# Patient Record
Sex: Male | Born: 1990 | Race: Black or African American | Hispanic: No | Marital: Single | Smoking: Former smoker
Health system: Southern US, Community
[De-identification: ages and names within clinical notes are randomized; demographics above are authoritative.]

## PROBLEM LIST (undated history)

## (undated) DIAGNOSIS — F329 Major depressive disorder, single episode, unspecified: Secondary | ICD-10-CM

## (undated) DIAGNOSIS — F32A Depression, unspecified: Secondary | ICD-10-CM

---

## 2013-02-23 ENCOUNTER — Emergency Department: Payer: Self-pay | Admitting: Emergency Medicine

## 2013-02-26 LAB — BETA STREP CULTURE(ARMC)

## 2013-10-02 ENCOUNTER — Emergency Department: Payer: Self-pay | Admitting: Internal Medicine

## 2013-10-02 LAB — BASIC METABOLIC PANEL
Anion Gap: 6 — ABNORMAL LOW (ref 7–16)
BUN: 9 mg/dL (ref 7–18)
CO2: 30 mmol/L (ref 21–32)
CREATININE: 1.01 mg/dL (ref 0.60–1.30)
Calcium, Total: 8.9 mg/dL (ref 8.5–10.1)
Chloride: 104 mmol/L (ref 98–107)
EGFR (African American): >60
GLUCOSE: 89 mg/dL (ref 65–99)
Osmolality: 278 (ref 275–301)
Potassium: 3.8 mmol/L (ref 3.5–5.1)
Sodium: 140 mmol/L (ref 136–145)

## 2013-10-02 LAB — CBC
HCT: 47.2 % (ref 40.0–52.0)
HGB: 15.3 g/dL (ref 13.0–18.0)
MCH: 29.3 pg (ref 26.0–34.0)
MCHC: 32.5 g/dL (ref 32.0–36.0)
MCV: 90 fL (ref 80–100)
Platelet: 204 10*3/uL (ref 150–440)
RBC: 5.22 10*6/uL (ref 4.40–5.90)
RDW: 14.1 % (ref 11.5–14.5)
WBC: 8.4 10*3/uL (ref 3.8–10.6)

## 2013-10-02 LAB — LIPASE, BLOOD: Lipase: 214 U/L (ref 73–393)

## 2013-10-02 LAB — MONONUCLEOSIS SCREEN: Mono Test: NEGATIVE

## 2013-10-05 LAB — BETA STREP CULTURE(ARMC)

## 2013-10-24 ENCOUNTER — Emergency Department: Payer: Self-pay | Admitting: Internal Medicine

## 2013-10-24 LAB — CBC WITH DIFFERENTIAL/PLATELET
Basophil #: 0.1 10*3/uL (ref 0.0–0.1)
Basophil %: 0.8 %
Eosinophil #: 0.2 10*3/uL (ref 0.0–0.7)
Eosinophil %: 3.1 %
HCT: 47.9 % (ref 40.0–52.0)
HGB: 15.2 g/dL (ref 13.0–18.0)
LYMPHS ABS: 2.5 10*3/uL (ref 1.0–3.6)
LYMPHS PCT: 32.9 %
MCH: 28.6 pg (ref 26.0–34.0)
MCHC: 31.8 g/dL — ABNORMAL LOW (ref 32.0–36.0)
MCV: 90 fL (ref 80–100)
Monocyte #: 0.6 x10 3/mm (ref 0.2–1.0)
Monocyte %: 8.1 %
NEUTROS PCT: 55.1 %
Neutrophil #: 4.1 10*3/uL (ref 1.4–6.5)
PLATELETS: 205 10*3/uL (ref 150–440)
RBC: 5.32 10*6/uL (ref 4.40–5.90)
RDW: 14.1 % (ref 11.5–14.5)
WBC: 7.5 10*3/uL (ref 3.8–10.6)

## 2013-10-24 LAB — COMPREHENSIVE METABOLIC PANEL
ALBUMIN: 3.8 g/dL (ref 3.4–5.0)
AST: 40 U/L — AB (ref 15–37)
Alkaline Phosphatase: 69 U/L
Anion Gap: 3 — ABNORMAL LOW (ref 7–16)
BUN: 11 mg/dL (ref 7–18)
Bilirubin,Total: 0.6 mg/dL (ref 0.2–1.0)
CHLORIDE: 106 mmol/L (ref 98–107)
CREATININE: 0.9 mg/dL (ref 0.60–1.30)
Calcium, Total: 8.7 mg/dL (ref 8.5–10.1)
Co2: 29 mmol/L (ref 21–32)
Glucose: 86 mg/dL (ref 65–99)
OSMOLALITY: 274 (ref 275–301)
Potassium: 4 mmol/L (ref 3.5–5.1)
SGPT (ALT): 85 U/L — ABNORMAL HIGH
SODIUM: 138 mmol/L (ref 136–145)
TOTAL PROTEIN: 7.8 g/dL (ref 6.4–8.2)

## 2013-10-24 LAB — URINALYSIS, COMPLETE
BILIRUBIN, UR: NEGATIVE
BLOOD: NEGATIVE
Bacteria: NONE SEEN
GLUCOSE, UR: NEGATIVE mg/dL (ref 0–75)
Ketone: NEGATIVE
Leukocyte Esterase: NEGATIVE
Nitrite: NEGATIVE
PROTEIN: NEGATIVE
Ph: 6 (ref 4.5–8.0)
RBC,UR: <1 /HPF (ref 0–5)
SPECIFIC GRAVITY: 1.015 (ref 1.003–1.030)
Squamous Epithelial: NONE SEEN

## 2013-10-24 LAB — LIPASE, BLOOD: Lipase: 242 U/L (ref 73–393)

## 2015-01-20 ENCOUNTER — Emergency Department
Admission: EM | Admit: 2015-01-20 | Discharge: 2015-01-20 | Disposition: A | Discharge status: Home / Self Care | Payer: Self-pay | Attending: Emergency Medicine | Admitting: Emergency Medicine

## 2015-01-20 DIAGNOSIS — Y9289 Other specified places as the place of occurrence of the external cause: Secondary | ICD-10-CM | POA: Insufficient documentation

## 2015-01-20 DIAGNOSIS — F17200 Nicotine dependence, unspecified, uncomplicated: Secondary | ICD-10-CM | POA: Insufficient documentation

## 2015-01-20 DIAGNOSIS — Y9389 Activity, other specified: Secondary | ICD-10-CM | POA: Insufficient documentation

## 2015-01-20 DIAGNOSIS — Y990 Civilian activity done for income or pay: Secondary | ICD-10-CM | POA: Insufficient documentation

## 2015-01-20 DIAGNOSIS — W268XXA Contact with other sharp object(s), not elsewhere classified, initial encounter: Secondary | ICD-10-CM | POA: Insufficient documentation

## 2015-01-20 DIAGNOSIS — S61012A Laceration without foreign body of left thumb without damage to nail, initial encounter: Secondary | ICD-10-CM | POA: Insufficient documentation

## 2015-01-20 NOTE — ED Notes (Signed)
States he was cutting a wire with a sharp blade yesterday, laceration noted to tip of left thumb   Bleeding controlled at present but states he was unable to control bleeding yesterday

## 2015-01-20 NOTE — ED Notes (Signed)
Pt states he cut his left thumb on wire yesterday

## 2015-01-20 NOTE — ED Notes (Signed)
Pt asked by this tech if this would be filed under [employer] and he states "no I'll handle it" First nurse notified

## 2015-01-20 NOTE — ED Provider Notes (Signed)
[facility] Emergency Department Provider Note  ____________________________________________  Time seen: Approximately 10:13AM  I have reviewed the triage vital signs and the nursing notes.   HISTORY  Chief Complaint Laceration   HPI Charles Harper is a 24 y.o. male who presents to the emergency department for evaluation of left thumb injury. He cut it at work yesterday. He states that it bled a lot initially, but no bleeding since last night. His tetanus shot is not up to date.   History reviewed. No pertinent past medical history.  There are no active problems to display for this patient.   History reviewed. No pertinent past surgical history.  No current outpatient prescriptions on file.  Allergies Review of patient's allergies indicates no known allergies.  No family history on file.  Social History Social History  Substance Use Topics  . Smoking status: Current Every Day Smoker  . Smokeless tobacco: None  . Alcohol Use: No    Review of Systems   Constitutional: No fever/chills Eyes: No visual changes. ENT: No congestion or rhinorrhea Cardiovascular: Denies chest pain. Respiratory: Denies shortness of breath. Gastrointestinal: No abdominal pain.  No nausea, no vomiting.  No diarrhea.  No constipation. Genitourinary: Negative for dysuria. Musculoskeletal: Negative for back pain. Skin: laceration to the left thumb Neurological: Negative for headaches, focal weakness or numbness.  10-point ROS otherwise negative.  ____________________________________________   PHYSICAL EXAM:  VITAL SIGNS: ED Triage Vitals  Enc Vitals Group     BP 01/20/15 1005 129/75 mmHg     Pulse Rate 01/20/15 1005 103     Resp 01/20/15 1005 16     Temp 01/20/15 1005 98.2 F (36.8 C)     Temp Source 01/20/15 1005 Oral     SpO2 01/20/15 1005 99 %     Weight 01/20/15 1005 205 lb (92.987 kg)     Height 01/20/15 1005 5\' 10"  (1.778 m)     Head Cir --       Peak Flow --      Pain Score 01/20/15 1005 4     Pain Loc --      Pain Edu? --      Excl. in GC? --     Constitutional: Alert and oriented. Well appearing and in no acute distress. Eyes: Conjunctivae are normal. PERRL. EOMI. Head: Atraumatic. Nose: No congestion/rhinnorhea. Mouth/Throat: Mucous membranes are moist.  Oropharynx non-erythematous. No oral lesions. Neck: No stridor. Cardiovascular: Normal rate, regular rhythm.  Good peripheral circulation. Respiratory: Normal respiratory effort.  No retractions. Lungs CTAB. Gastrointestinal: Soft and nontender. No distention. No abdominal bruits.  Musculoskeletal: No lower extremity tenderness nor edema.  No joint effusions. Neurologic:  Normal speech and language. No gross focal neurologic deficits are appreciated. Speech is normal. No gait instability. Skin: 1.5cm laceration to the medial aspect of the left thumb; Negative for petechiae.  Psychiatric: Mood and affect are normal. Speech and behavior are normal.  ____________________________________________   LABS (all labs ordered are listed, but only abnormal results are displayed)  Labs Reviewed - No data to display ____________________________________________  EKG   ____________________________________________  RADIOLOGY  Not indicated. ____________________________________________   PROCEDURES  Procedure(s) performed: None ____________________________________________   INITIAL IMPRESSION / ASSESSMENT AND PLAN / ED COURSE  Pertinent labs & imaging results that were available during my care of the patient were reviewed by me and considered in my medical decision making (see chart for details).  Laceration not sutured due to time since injury. Thumb soaked in betadine  and normal saline. Tetanus shot updated. Patient was given verbal wound care instructions and advised to follow up with his PCP or return to the ER for symptoms of concern.   ____________________________________________   FINAL CLINICAL IMPRESSION(S) / ED DIAGNOSES  Final diagnoses:  Laceration of left thumb, initial encounter       Chinita Pester, FNP 01/20/15 1806  Arnaldo Natal, MD 01/22/15 [phone]

## 2015-06-01 ENCOUNTER — Emergency Department
Admission: EM | Admit: 2015-06-01 | Discharge: 2015-06-01 | Disposition: A | Discharge status: Home / Self Care | Payer: Self-pay | Attending: Student | Admitting: Student

## 2015-06-01 DIAGNOSIS — J029 Acute pharyngitis, unspecified: Secondary | ICD-10-CM | POA: Insufficient documentation

## 2015-06-01 DIAGNOSIS — F17200 Nicotine dependence, unspecified, uncomplicated: Secondary | ICD-10-CM | POA: Insufficient documentation

## 2015-06-01 DIAGNOSIS — R112 Nausea with vomiting, unspecified: Secondary | ICD-10-CM | POA: Insufficient documentation

## 2015-06-01 LAB — CBC WITH DIFFERENTIAL/PLATELET
BASOS ABS: 0 10*3/uL (ref 0–0.1)
Basophils Relative: 0 %
EOS ABS: 0.2 10*3/uL (ref 0–0.7)
EOS PCT: 3 %
HCT: 46 % (ref 40.0–52.0)
Hemoglobin: 15.3 g/dL (ref 13.0–18.0)
LYMPHS ABS: 2.2 10*3/uL (ref 1.0–3.6)
LYMPHS PCT: 38 %
MCH: 28.6 pg (ref 26.0–34.0)
MCHC: 33.2 g/dL (ref 32.0–36.0)
MCV: 86.1 fL (ref 80.0–100.0)
MONO ABS: 0.5 10*3/uL (ref 0.2–1.0)
Monocytes Relative: 8 %
Neutro Abs: 2.8 10*3/uL (ref 1.4–6.5)
Neutrophils Relative %: 51 %
PLATELETS: 210 10*3/uL (ref 150–440)
RBC: 5.34 MIL/uL (ref 4.40–5.90)
RDW: 14.6 % — AB (ref 11.5–14.5)
WBC: 5.6 10*3/uL (ref 3.8–10.6)

## 2015-06-01 LAB — COMPREHENSIVE METABOLIC PANEL
ALT: 22 U/L (ref 17–63)
AST: 23 U/L (ref 15–41)
Albumin: 4.3 g/dL (ref 3.5–5.0)
Alkaline Phosphatase: 50 U/L (ref 38–126)
Anion gap: 9 (ref 5–15)
BUN: 7 mg/dL (ref 6–20)
CHLORIDE: 105 mmol/L (ref 101–111)
CO2: 25 mmol/L (ref 22–32)
Calcium: 9.2 mg/dL (ref 8.9–10.3)
Creatinine, Ser: 0.82 mg/dL (ref 0.61–1.24)
Glucose, Bld: 85 mg/dL (ref 65–99)
POTASSIUM: 3.5 mmol/L (ref 3.5–5.1)
SODIUM: 139 mmol/L (ref 135–145)
Total Bilirubin: 1.2 mg/dL (ref 0.3–1.2)
Total Protein: 7.9 g/dL (ref 6.5–8.1)

## 2015-06-01 LAB — POCT RAPID STREP A: Streptococcus, Group A Screen (Direct): NEGATIVE

## 2015-06-01 LAB — LIPASE, BLOOD: LIPASE: 47 U/L (ref 11–51)

## 2015-06-01 MED ORDER — MORPHINE SULFATE (PF) 4 MG/ML IV SOLN
INTRAVENOUS | Status: AC
  Filled 2015-06-01: qty 1

## 2015-06-01 MED ORDER — SODIUM CHLORIDE 0.9 % IV BOLUS (SEPSIS)
1000.0000 mL | Freq: Once | INTRAVENOUS | Status: AC
  Administered 2015-06-01: 1000 mL via INTRAVENOUS

## 2015-06-01 MED ORDER — ONDANSETRON HCL 4 MG/2ML IJ SOLN
4.0000 mg | Freq: Once | INTRAMUSCULAR | Status: AC
  Administered 2015-06-01: 4 mg via INTRAVENOUS
  Filled 2015-06-01: qty 2

## 2015-06-01 MED ORDER — ONDANSETRON 4 MG PO TBDP: 4.0000 mg | ORAL_TABLET | Freq: Three times a day (TID) | ORAL | Status: DC | PRN

## 2015-06-01 NOTE — ED Provider Notes (Signed)
[facility] Emergency Department Provider Note   ____________________________________________  Time seen: Approximately 7:12 AM  I have reviewed the triage vital signs and the nursing notes.   HISTORY  Chief Complaint Emesis    HPI Tia AlertJaquan All is a 25 y.o. male with no chronic medical problems who presents for evaluation of multiple episodes of nonbloody nonbilious emesis over the past 3 days, gradual onset, constant since onset, moderate, no modifying factors. He also has mild sore throat and cough. No fevers, no diarrhea, continues to pass flatus. No abdominal pain. No known sick contacts. No chest pain or difficulty breathing. No dysuria or hematuria.   No past medical history on file.  There are no active problems to display for this patient.   No past surgical history on file.  Current Outpatient Rx  Name  Route  Sig  Dispense  Refill  . ondansetron (ZOFRAN ODT) 4 MG disintegrating tablet   Oral   Take 1 tablet (4 mg total) by mouth every 8 (eight) hours as needed for nausea or vomiting.   12 tablet   0     Allergies Review of patient's allergies indicates no known allergies.  No family history on file.  Social History Social History  Substance Use Topics  . Smoking status: Current Every Day Smoker  . Smokeless tobacco: Not on file  . Alcohol Use: No    Review of Systems Constitutional: No fever/chills Eyes: No visual changes. ENT: + sore throat. Cardiovascular: Denies chest pain. Respiratory: Denies shortness of breath. Gastrointestinal: No abdominal pain.  + nausea, + vomiting.  No diarrhea.  No constipation. Genitourinary: Negative for dysuria. Musculoskeletal: Negative for back pain. Skin: Negative for rash. Neurological: Negative for headaches, focal weakness or numbness.  10-point ROS otherwise negative.  ____________________________________________   PHYSICAL EXAM:  VITAL SIGNS: ED Triage Vitals  Enc  Vitals Group     BP 06/01/15 0632 139/88 mmHg     Pulse Rate 06/01/15 0632 94     Resp 06/01/15 0632 18     Temp 06/01/15 0632 98.3 F (36.8 C)     Temp Source 06/01/15 0632 Oral     SpO2 06/01/15 0632 100 %     Weight 06/01/15 0632 180 lb (81.647 kg)     Height 06/01/15 0632 5\' 10"  (1.778 m)     Head Cir --      Peak Flow --      Pain Score 06/01/15 0633 4     Pain Loc --      Pain Edu? --      Excl. in GC? --     Constitutional: Alert and oriented. Well appearing and in no acute distress. Eyes: Conjunctivae are normal. PERRL. EOMI. Head: Atraumatic. Nose: No congestion/rhinnorhea. Mouth/Throat: Mucous membranes are moist.  Oropharynx mildly erythematous without exudate. Neck: No stridor. Supple without meningismus. Cardiovascular: Normal rate, regular rhythm. Grossly normal heart sounds.  Good peripheral circulation. Respiratory: Normal respiratory effort.  No retractions. Lungs CTAB. Gastrointestinal: Soft and nontender. No distention.  No CVA tenderness. Genitourinary: deferred Musculoskeletal: No lower extremity tenderness nor edema.  No joint effusions. Neurologic:  Normal speech and language. No gross focal neurologic deficits are appreciated. No gait instability. Skin:  Skin is warm, dry and intact. No rash noted. Psychiatric: Mood and affect are normal. Speech and behavior are normal.  ____________________________________________   LABS (all labs ordered are listed, but only abnormal results are displayed)  Labs Reviewed  CBC WITH DIFFERENTIAL/PLATELET - Abnormal;  Notable for the following:    RDW 14.6 (*)    All other components within normal limits  COMPREHENSIVE METABOLIC PANEL  LIPASE, BLOOD  POCT RAPID STREP A   ____________________________________________  EKG  none ____________________________________________  RADIOLOGY  none ____________________________________________   PROCEDURES  Procedure(s) performed: None  Critical Care performed:  No  ____________________________________________   INITIAL IMPRESSION / ASSESSMENT AND PLAN / ED COURSE  Pertinent labs & imaging results that were available during my care of the patient were reviewed by me and considered in my medical decision making (see chart for details).  Jaquille Kau is a 25 y.o. male with no chronic medical problems who presents for evaluation of multiple episodes of nonbloody nonbilious emesis over the past 3 days. On exam, he is generally well-appearing and in no acute distress. Vital signs stable, he is afebrile. He does have mild erythema in the oropharynx without swelling or exudate. His abdominal exam is benign. Suspect viral syndrome as the most likely cause of his constellation of symptoms. CBC CMP, lipase unremarkable. We'll treat symptomatically with IV fluids and antiemetics and anticipate discharge with PCP follow-up and return precautions after he successfully by mouth challenges.   ----------------------------------------- 9:05 AM on 06/01/2015 ----------------------------------------- Strep negative. Patient tolerating by mouth in the emergency department without vomiting. Discussed return precautions, need for close follow-up and he is comfortable with the discharge plan. DC home.  ____________________________________________   FINAL CLINICAL IMPRESSION(S) / ED DIAGNOSES  Final diagnoses:  Non-intractable vomiting with nausea, vomiting of unspecified type  Sore throat      NEW MEDICATIONS STARTED DURING THIS VISIT:  New Prescriptions   ONDANSETRON (ZOFRAN ODT) 4 MG DISINTEGRATING TABLET    Take 1 tablet (4 mg total) by mouth every 8 (eight) hours as needed for nausea or vomiting.     Note:  This document was prepared using Dragon voice recognition software and may include unintentional dictation errors.    Gayla Doss, MD 06/01/15 [phone]

## 2015-06-01 NOTE — ED Notes (Signed)
Pt reports that he started vomiting 3 days ago (has vomited 8 times in the last 24 hours) - pt c/o sore throat in the last 24 hours - pt denies diarrhea - last normal BM was Monday - pt denies any other symptoms

## 2015-06-01 NOTE — Discharge Instructions (Signed)
Nausea and Vomiting  Nausea means you feel sick to your stomach. Throwing up (vomiting) is a reflex where stomach contents come out of your mouth.  HOME CARE   · Take medicine as told by your doctor.  · Do not force yourself to eat. However, you do need to drink fluids.  · If you feel like eating, eat a normal diet as told by your doctor.    Eat rice, wheat, potatoes, bread, lean meats, yogurt, fruits, and vegetables.    Avoid high-fat foods.  · Drink enough fluids to keep your pee (urine) clear or pale yellow.  · Ask your doctor how to replace body fluid losses (rehydrate). Signs of body fluid loss (dehydration) include:    Feeling very thirsty.    Dry lips and mouth.    Feeling dizzy.    Dark pee.    Peeing less than normal.    Feeling confused.    Fast breathing or heart rate.  GET HELP RIGHT AWAY IF:   · You have blood in your throw up.  · You have black or bloody poop (stool).  · You have a bad headache or stiff neck.  · You feel confused.  · You have bad belly (abdominal) pain.  · You have chest pain or trouble breathing.  · You do not pee at least once every 8 hours.  · You have cold, clammy skin.  · You keep throwing up after 24 to 48 hours.  · You have a fever.  MAKE SURE YOU:   · Understand these instructions.  · Will watch your condition.  · Will get help right away if you are not doing well or get worse.     This information is not intended to replace advice given to you by your health care provider. Make sure you discuss any questions you have with your health care provider.     Document Released: 06/27/2007 Document Revised: 04/02/2011 Document Reviewed: 06/09/2010  Elsevier Interactive Patient Education ©2016 Else[employer] Inc.

## 2015-06-01 NOTE — ED Notes (Signed)
Pt in with co vomiting x 3 days co abd pain and states throat is irritated.

## 2015-06-06 ENCOUNTER — Encounter: Payer: Self-pay | Admitting: *Deleted

## 2015-06-06 ENCOUNTER — Emergency Department
Admission: EM | Admit: 2015-06-06 | Discharge: 2015-06-06 | Disposition: A | Discharge status: Home / Self Care | Payer: Self-pay | Attending: Emergency Medicine | Admitting: Emergency Medicine

## 2015-06-06 DIAGNOSIS — R1013 Epigastric pain: Secondary | ICD-10-CM

## 2015-06-06 DIAGNOSIS — F17210 Nicotine dependence, cigarettes, uncomplicated: Secondary | ICD-10-CM | POA: Insufficient documentation

## 2015-06-06 DIAGNOSIS — K859 Acute pancreatitis without necrosis or infection, unspecified: Secondary | ICD-10-CM | POA: Insufficient documentation

## 2015-06-06 DIAGNOSIS — K2970 Gastritis, unspecified, without bleeding: Secondary | ICD-10-CM | POA: Insufficient documentation

## 2015-06-06 LAB — URINALYSIS COMPLETE WITH MICROSCOPIC (ARMC ONLY)
Bacteria, UA: NONE SEEN
Bilirubin Urine: NEGATIVE
Glucose, UA: NEGATIVE mg/dL
HGB URINE DIPSTICK: NEGATIVE
Ketones, ur: NEGATIVE mg/dL
LEUKOCYTES UA: NEGATIVE
NITRITE: NEGATIVE
PH: 5 (ref 5.0–8.0)
PROTEIN: NEGATIVE mg/dL
Specific Gravity, Urine: 1.021 (ref 1.005–1.030)

## 2015-06-06 LAB — COMPREHENSIVE METABOLIC PANEL
ALK PHOS: 50 U/L (ref 38–126)
ALT: 30 U/L (ref 17–63)
AST: 31 U/L (ref 15–41)
Albumin: 4.2 g/dL (ref 3.5–5.0)
Anion gap: 7 (ref 5–15)
BILIRUBIN TOTAL: 0.5 mg/dL (ref 0.3–1.2)
BUN: 12 mg/dL (ref 6–20)
CALCIUM: 8.9 mg/dL (ref 8.9–10.3)
CO2: 26 mmol/L (ref 22–32)
CREATININE: 0.85 mg/dL (ref 0.61–1.24)
Chloride: 106 mmol/L (ref 101–111)
GFR calc Af Amer: >60 mL/min (ref >60)
GFR calc non Af Amer: >60 mL/min (ref >60)
Glucose, Bld: 91 mg/dL (ref 65–99)
POTASSIUM: 4.3 mmol/L (ref 3.5–5.1)
Sodium: 139 mmol/L (ref 135–145)
TOTAL PROTEIN: 7.7 g/dL (ref 6.5–8.1)

## 2015-06-06 LAB — LIPASE, BLOOD: Lipase: 102 U/L — ABNORMAL HIGH (ref 11–51)

## 2015-06-06 LAB — CBC
HEMATOCRIT: 48.2 % (ref 40.0–52.0)
Hemoglobin: 15.9 g/dL (ref 13.0–18.0)
MCH: 28.4 pg (ref 26.0–34.0)
MCHC: 33.1 g/dL (ref 32.0–36.0)
MCV: 85.9 fL (ref 80.0–100.0)
PLATELETS: 208 10*3/uL (ref 150–440)
RBC: 5.61 MIL/uL (ref 4.40–5.90)
RDW: 14.8 % — AB (ref 11.5–14.5)
WBC: 6.8 10*3/uL (ref 3.8–10.6)

## 2015-06-06 LAB — TROPONIN I: Troponin I: <0.03 ng/mL (ref <0.031)

## 2015-06-06 MED ORDER — ONDANSETRON HCL 4 MG/2ML IJ SOLN
4.0000 mg | Freq: Once | INTRAMUSCULAR | Status: AC
  Administered 2015-06-06: 4 mg via INTRAVENOUS
  Filled 2015-06-06: qty 2

## 2015-06-06 MED ORDER — ONDANSETRON HCL 4 MG/2ML IJ SOLN
INTRAMUSCULAR | Status: AC
  Administered 2015-06-06: 4 mg via INTRAVENOUS
  Filled 2015-06-06: qty 2

## 2015-06-06 MED ORDER — ONDANSETRON 4 MG PO TBDP: 4.0000 mg | ORAL_TABLET | Freq: Three times a day (TID) | ORAL | Status: DC | PRN

## 2015-06-06 MED ORDER — SODIUM CHLORIDE 0.9 % IV BOLUS (SEPSIS)
1000.0000 mL | Freq: Once | INTRAVENOUS | Status: AC
  Administered 2015-06-06: 1000 mL via INTRAVENOUS

## 2015-06-06 MED ORDER — ONDANSETRON HCL 4 MG/2ML IJ SOLN
4.0000 mg | Freq: Once | INTRAMUSCULAR | Status: AC | PRN
  Administered 2015-06-06: 4 mg via INTRAVENOUS

## 2015-06-06 MED ORDER — GI COCKTAIL ~~LOC~~
30.0000 mL | Freq: Once | ORAL | Status: AC
  Administered 2015-06-06: 30 mL via ORAL
  Filled 2015-06-06: qty 30

## 2015-06-06 MED ORDER — PANTOPRAZOLE SODIUM 40 MG PO TBEC: 40.0000 mg | DELAYED_RELEASE_TABLET | Freq: Every day | ORAL | Status: DC

## 2015-06-06 MED ORDER — HYDROCODONE-ACETAMINOPHEN 5-325 MG PO TABS: 1.0000 | ORAL_TABLET | ORAL | Status: DC | PRN

## 2015-06-06 NOTE — ED Provider Notes (Addendum)
Patient feeling much better, I had initially ordered a dose of morphine, but the patient did not want to take this as he wanted to drive home. The pain did ease off on its own and we discussed return precautions.  Patient was discharged with Dr. Awanda MinkPaduchowski's prepared discharge follow-up instructions.  Governor Rooksebecca Del Overfelt, MD 06/06/15 [phone]  Governor Rooksebecca Nelissa Bolduc, MD 06/06/15 [phone]

## 2015-06-06 NOTE — Discharge Instructions (Signed)
Please take your medications as prescribed. Return to the emergency department for any acute worsening of pain, vomiting unable to keep down fluids, or any other symptom personally concerning to yourself. Otherwise please follow-up with GI medicine by calling the number provided. As we discussed avoid alcohol, as well as NSAID use such as ibuprofen/Motrin/aspirin. Please use over-the-counter Maalox as needed for abdominal discomfort.   Abdominal Pain, Adult Many things can cause abdominal pain. Usually, abdominal pain is not caused by a disease and will improve without treatment. It can often be observed and treated at home. Your health care provider will do a physical exam and possibly order blood tests and X-rays to help determine the seriousness of your pain. However, in many cases, more time must pass before a clear cause of the pain can be found. Before that point, your health care provider may not know if you need more testing or further treatment. HOME CARE INSTRUCTIONS Monitor your abdominal pain for any changes. The following actions may help to alleviate any discomfort you are experiencing:  Only take over-the-counter or prescription medicines as directed by your health care provider.  Do not take laxatives unless directed to do so by your health care provider.  Try a clear liquid diet (broth, tea, or water) as directed by your health care provider. Slowly move to a bland diet as tolerated. SEEK MEDICAL CARE IF:  You have unexplained abdominal pain.  You have abdominal pain associated with nausea or diarrhea.  You have pain when you urinate or have a bowel movement.  You experience abdominal pain that wakes you in the night.  You have abdominal pain that is worsened or improved by eating food.  You have abdominal pain that is worsened with eating fatty foods.  You have a fever. SEEK IMMEDIATE MEDICAL CARE IF:  Your pain does not go away within 2 hours.  You keep throwing up  (vomiting).  Your pain is felt only in portions of the abdomen, such as the right side or the left lower portion of the abdomen.  You pass bloody or black tarry stools. MAKE SURE YOU:  Understand these instructions.  Will watch your condition.  Will get help right away if you are not doing well or get worse.   This information is not intended to replace advice given to you by your health care provider. Make sure you discuss any questions you have with your health care provider.   Document Released: 10/18/2004 Document Revised: 09/29/2014 Document Reviewed: 09/17/2012 Elsevier Interactive Patient Education 2016 Elsevier Inc.  Gastritis, Adult Gastritis is soreness and puffiness (inflammation) of the lining of the stomach. If you do not get help, gastritis can cause bleeding and sores (ulcers) in the stomach. HOME CARE   Only take medicine as told by your doctor.  If you were given antibiotic medicines, take them as told. Finish the medicines even if you start to feel better.  Drink enough fluids to keep your pee (urine) clear or pale yellow.  Avoid foods and drinks that make your problems worse. Foods you may want to avoid include:  Caffeine or alcohol.  Chocolate.  Mint.  Garlic and onions.  Spicy foods.  Citrus fruits, including oranges, lemons, or limes.  Food containing tomatoes, including sauce, chili, salsa, and pizza.  Fried and fatty foods.  Eat small meals throughout the day instead of large meals. GET HELP RIGHT AWAY IF:   You have black or dark red poop (stools).  You throw up (vomit)  blood. It may look like coffee grounds.  You cannot keep fluids down.  Your belly (abdominal) pain gets worse.  You have a fever.  You do not feel better after 1 week.  You have any other questions or concerns. MAKE SURE YOU:   Understand these instructions.  Will watch your condition.  Will get help right away if you are not doing well or get worse.     This information is not intended to replace advice given to you by your health care provider. Make sure you discuss any questions you have with your health care provider.   Document Released: 06/27/2007 Document Revised: 04/02/2011 Document Reviewed: 02/21/2011 Elsevier Interactive Patient Education 2016 Elsevier Inc.  Acute Pancreatitis Acute pancreatitis is a disease in which the pancreas becomes suddenly inflamed. The pancreas is a large gland located behind your stomach. The pancreas produces enzymes that help digest food. The pancreas also releases the hormones glucagon and insulin that help regulate blood sugar. Damage to the pancreas occurs when the digestive enzymes from the pancreas are activated and begin attacking the pancreas before being released into the intestine. Most acute attacks last a couple of days and can cause serious complications. Some people become dehydrated and develop low blood pressure. In severe cases, bleeding into the pancreas can lead to shock and can be life-threatening. The lungs, heart, and kidneys may fail. CAUSES  Pancreatitis can happen to anyone. In some cases, the cause is unknown. Most cases are caused by:  Alcohol abuse.  Gallstones. Other less common causes are:  Certain medicines.  Exposure to certain chemicals.  Infection.  Damage caused by an accident (trauma).  Abdominal surgery. SYMPTOMS   Pain in the upper abdomen that may radiate to the back.  Tenderness and swelling of the abdomen.  Nausea and vomiting. DIAGNOSIS  Your caregiver will perform a physical exam. Blood and stool tests may be done to confirm the diagnosis. Imaging tests may also be done, such as X-rays, CT scans, or an ultrasound of the abdomen. TREATMENT  Treatment usually requires a stay in the hospital. Treatment may include:  Pain medicine.  Fluid replacement through an intravenous line (IV).  Placing a tube in the stomach to remove stomach contents and  control vomiting.  Not eating for 3 or 4 days. This gives your pancreas a rest, because enzymes are not being produced that can cause further damage.  Antibiotic medicines if your condition is caused by an infection.  Surgery of the pancreas or gallbladder. HOME CARE INSTRUCTIONS   Follow the diet advised by your caregiver. This may involve avoiding alcohol and decreasing the amount of fat in your diet.  Eat smaller, more frequent meals. This reduces the amount of digestive juices the pancreas produces.  Drink enough fluids to keep your urine clear or pale yellow.  Only take over-the-counter or prescription medicines as directed by your caregiver.  Avoid drinking alcohol if it caused your condition.  Do not smoke.  Get plenty of rest.  Check your blood sugar at home as directed by your caregiver.  Keep all follow-up appointments as directed by your caregiver. SEEK MEDICAL CARE IF:   You do not recover as quickly as expected.  You develop new or worsening symptoms.  You have persistent pain, weakness, or nausea.  You recover and then have another episode of pain. SEEK IMMEDIATE MEDICAL CARE IF:   You are unable to eat or keep fluids down.  Your pain becomes severe.  You have a  fever or persistent symptoms for more than 2 to 3 days.  You have a fever and your symptoms suddenly get worse.  Your skin or the white part of your eyes turn yellow (jaundice).  You develop vomiting.  You feel dizzy, or you faint.  Your blood sugar is high (over 300 mg/dL). MAKE SURE YOU:   Understand these instructions.  Will watch your condition.  Will get help right away if you are not doing well or get worse.   This information is not intended to replace advice given to you by your health care provider. Make sure you discuss any questions you have with your health care provider.   Document Released: 01/08/2005 Document Revised: 07/10/2011 Document Reviewed: 04/19/2011 Elsevier  Interactive Patient Education [employer]

## 2015-06-06 NOTE — ED Notes (Signed)
Pt c/o LUQ abdominal pain, n/v since Friday, worsening on Sunday. pT REENTLY SEEN FOR SAME LESS THAN A WEEK AGO. pT ACTIVELY VOMITING during triage.

## 2015-06-06 NOTE — ED Provider Notes (Signed)
[facility] Emergency Department Provider Note  Time seen: 6:34 AM  I have reviewed the triage vital signs and the nursing notes.   HISTORY  Chief Complaint Emesis    HPI Charles Harper is a 25 y.o. male with no past medical history who presents the emergency department with upper abdominal pain, nausea and vomiting. According to the patient for the past week he has been having upper abdominal pain, nausea and vomiting. He states he was seen in the emergency department 06/01/15 for the same. States his symptoms were improving on the medications he was prescribed however he drank 1 shot of whiskey Saturday, and beginning Saturday night he states all the pain, nausea and vomiting return, patient continued with pain, nausea and vomiting Sunday so he came to the emergency department this morning. Patient describes the pain as moderate to severe epigastric to left upper quadrant pain. Burning in quality. Denies diarrhea. States nausea and vomiting. Denies fever. Denies chest pain or shortness of breath.     History reviewed. No pertinent past medical history.  There are no active problems to display for this patient.   History reviewed. No pertinent past surgical history.  Current Outpatient Rx  Name  Route  Sig  Dispense  Refill  . ondansetron (ZOFRAN ODT) 4 MG disintegrating tablet   Oral   Take 1 tablet (4 mg total) by mouth every 8 (eight) hours as needed for nausea or vomiting.   12 tablet   0     Allergies Review of patient's allergies indicates no known allergies.  History reviewed. No pertinent family history.  Social History Social History  Substance Use Topics  . Smoking status: Current Every Day Smoker    Types: Cigarettes  . Smokeless tobacco: Never Used  . Alcohol Use: Yes     Comment: occasionally    Review of Systems Constitutional: Negative for fever. Cardiovascular: Negative for chest pain. Respiratory: Negative for shortness of  breath. Gastrointestinal: Positive for epigastric and left upper quadrant pain. Positive for nausea and vomiting. Negative for diarrhea. Genitourinary: Negative for dysuria. Musculoskeletal: Negative for back pain. Neurological: Negative for headache 10-point ROS otherwise negative.  ____________________________________________   PHYSICAL EXAM:  VITAL SIGNS: ED Triage Vitals  Enc Vitals Group     BP 06/06/15 0611 156/77 mmHg     Pulse Rate 06/06/15 0611 81     Resp 06/06/15 0611 20     Temp 06/06/15 0611 98 F (36.7 C)     Temp Source 06/06/15 0611 Oral     SpO2 06/06/15 0611 98 %     Weight 06/06/15 0611 190 lb (86.183 kg)     Height 06/06/15 0611  (1.727 m)     Head Cir --      Peak Flow --      Pain Score 06/06/15 0612 10     Pain Loc --      Pain Edu? --      Excl. in GC? --     Constitutional: Alert and oriented. Well appearing and in no distress. Eyes: Normal exam ENT   Head: Normocephalic and atraumatic.   Mouth/Throat: Mucous membranes are moist. Cardiovascular: Normal rate, regular rhythm. No murmur Respiratory: Normal respiratory effort without tachypnea nor retractions. Breath sounds are clear Gastrointestinal: Soft, moderate epigastric and left upper quadrant tenderness to palpation without rebound or guarding. No distention. Musculoskeletal: Nontender with normal range of motion in all extremities.  Neurologic:  Normal speech and language. No gross focal  neurologic deficits  Skin:  Skin is warm, dry and intact.  Psychiatric: Mood and affect are normal.   ____________________________________________    INITIAL IMPRESSION / ASSESSMENT AND PLAN / ED COURSE  Pertinent labs & imaging results that were available during my care of the patient were reviewed by me and considered in my medical decision making (see chart for details).  The patient presents the emergency department with continued epigastric and left upper quadrant pain along with  nausea and vomiting. She was seen for the same 06/01/15. His symptoms sound very suggestive of gastritis especially as it was provoked with alcohol over the weekend. We will check labs, dosage GI cocktail as well as Zofran for nausea. We will IV hydrate and closely monitor in the emergency department.   EKG reviewed and interpreted by myself shows normal sinus rhythm at 75 bpm, narrow QRS, normal axis, normal intervals, mild ST elevations in the lateral leads most consistent with early repolarization. No reciprocal depressions. Overall reassuring EKG.  Labs most consistent with pancreatitis, lipase of 100. I discussed with the patient the results, the need to stop drinking alcohol. As long as pain is controlled we plan to discharge home with pain medication, Protonix, Zofran, and PCP follow-up. I discussed strict return precautions which the patient is agreeable.  ____________________________________________   FINAL CLINICAL IMPRESSION(S) / ED DIAGNOSES  Epigastric pain Gastritis Pancreatitis  Minna AntisKevin Elim Economou, MD 06/06/15 [phone]

## 2015-06-14 ENCOUNTER — Encounter: Payer: Self-pay | Admitting: Emergency Medicine

## 2015-06-14 ENCOUNTER — Emergency Department
Admission: EM | Admit: 2015-06-14 | Discharge: 2015-06-14 | Disposition: A | Discharge status: Home / Self Care | Payer: Self-pay | Attending: Emergency Medicine | Admitting: Emergency Medicine

## 2015-06-14 DIAGNOSIS — F1290 Cannabis use, unspecified, uncomplicated: Secondary | ICD-10-CM | POA: Insufficient documentation

## 2015-06-14 DIAGNOSIS — F341 Dysthymic disorder: Secondary | ICD-10-CM

## 2015-06-14 DIAGNOSIS — F17210 Nicotine dependence, cigarettes, uncomplicated: Secondary | ICD-10-CM | POA: Insufficient documentation

## 2015-06-14 DIAGNOSIS — F4325 Adjustment disorder with mixed disturbance of emotions and conduct: Secondary | ICD-10-CM

## 2015-06-14 DIAGNOSIS — F329 Major depressive disorder, single episode, unspecified: Secondary | ICD-10-CM | POA: Insufficient documentation

## 2015-06-14 DIAGNOSIS — F1210 Cannabis abuse, uncomplicated: Secondary | ICD-10-CM

## 2015-06-14 DIAGNOSIS — R45851 Suicidal ideations: Secondary | ICD-10-CM

## 2015-06-14 DIAGNOSIS — Z79899 Other long term (current) drug therapy: Secondary | ICD-10-CM | POA: Insufficient documentation

## 2015-06-14 HISTORY — DX: Depression, unspecified: F32.A

## 2015-06-14 HISTORY — DX: Major depressive disorder, single episode, unspecified: F32.9

## 2015-06-14 LAB — URINE DRUG SCREEN, QUALITATIVE (ARMC ONLY)
AMPHETAMINES, UR SCREEN: NOT DETECTED
Barbiturates, Ur Screen: NOT DETECTED
Benzodiazepine, Ur Scrn: NOT DETECTED
COCAINE METABOLITE, UR ~~LOC~~: NOT DETECTED
Cannabinoid 50 Ng, Ur ~~LOC~~: POSITIVE — AB
MDMA (ECSTASY) UR SCREEN: NOT DETECTED
METHADONE SCREEN, URINE: NOT DETECTED
Opiate, Ur Screen: NOT DETECTED
Phencyclidine (PCP) Ur S: NOT DETECTED
TRICYCLIC, UR SCREEN: NOT DETECTED

## 2015-06-14 LAB — CBC
HEMATOCRIT: 44.5 % (ref 40.0–52.0)
HEMOGLOBIN: 14.5 g/dL (ref 13.0–18.0)
MCH: 28.4 pg (ref 26.0–34.0)
MCHC: 32.7 g/dL (ref 32.0–36.0)
MCV: 87.1 fL (ref 80.0–100.0)
PLATELETS: 210 10*3/uL (ref 150–440)
RBC: 5.11 MIL/uL (ref 4.40–5.90)
RDW: 14.4 % (ref 11.5–14.5)
WBC: 7.5 10*3/uL (ref 3.8–10.6)

## 2015-06-14 LAB — SALICYLATE LEVEL: Salicylate Lvl: <4 mg/dL (ref 2.8–30.0)

## 2015-06-14 LAB — COMPREHENSIVE METABOLIC PANEL
ALBUMIN: 4.3 g/dL (ref 3.5–5.0)
ALT: 25 U/L (ref 17–63)
ANION GAP: 6 (ref 5–15)
AST: 26 U/L (ref 15–41)
Alkaline Phosphatase: 48 U/L (ref 38–126)
BUN: 6 mg/dL (ref 6–20)
CO2: 24 mmol/L (ref 22–32)
Calcium: 9.4 mg/dL (ref 8.9–10.3)
Chloride: 109 mmol/L (ref 101–111)
Creatinine, Ser: 0.83 mg/dL (ref 0.61–1.24)
GFR calc Af Amer: >60 mL/min (ref >60)
GFR calc non Af Amer: >60 mL/min (ref >60)
GLUCOSE: 91 mg/dL (ref 65–99)
POTASSIUM: 3.9 mmol/L (ref 3.5–5.1)
SODIUM: 139 mmol/L (ref 135–145)
Total Bilirubin: 1.2 mg/dL (ref 0.3–1.2)
Total Protein: 7.9 g/dL (ref 6.5–8.1)

## 2015-06-14 LAB — ACETAMINOPHEN LEVEL

## 2015-06-14 LAB — ETHANOL: Alcohol, Ethyl (B): <5 mg/dL (ref <5)

## 2015-06-14 NOTE — Consult Note (Signed)
[facility] Consult   Reason for Consult:  Consult for this 25 year old man brought in by law enforcement under IVC after making suicidal statements Referring Physician:  Marcelene Butte Patient Identification: Charles Harper MRN:  [id number] Principal Diagnosis: Adjustment disorder with mixed disturbance of emotions and conduct Diagnosis:   Patient Active Problem List   Diagnosis Date Noted  . Adjustment disorder with mixed disturbance of emotions and conduct [F43.25] 06/14/2015  . Marijuana abuse [F12.10] 06/14/2015  . Dysthymia [F34.1] 06/14/2015    Total Time spent with patient: 1 hour  Subjective:   Charles Harper is a 25 y.o. male patient admitted with "I just needed to talk to someone".  HPI:  Patient interviewed. Chart reviewed. Labs reviewed. Case discussed with TTS and emergency room physician. 25 year old man brought in under IVC filed by Event organiser. Allegations that he had made suicidal threats. Patient admits this. He said that he wrote out some things on a paper plate at home in a sharpie marker that implied suicidality and then sent out a group text message to several members of his family that strongly implied suicidality. He says at the time he was not actually thinking of doing anything to hurt himself. What happened was that he and his girlfriend had been in an argument that escalated because he felt that she was being disrespectful and not listening to him. Rather than thinking about hurting her he had thoughts about killing himself. After sending these notes the patient went for a walk and then voluntarily came back to his home knowing the police would be there waiting for him and was cooperative with coming into the hospital. He says he's been under a lot of stress recently. His job is very demanding and has very long hours. He recently found out that his girlfriend is pregnant. He felt like nobody was paying attention to him and listening to his feelings.  Patient admits that he uses marijuana multiple times a day and has been doing so for 10 years. Doesn't use other drugs by his report and is not drinking regularly. Not currently getting any psychiatric treatment or on any psychiatric medicine. Not having psychotic symptoms or hallucinations. No actual intent to harm himself.  Social history: Patient lives with his girlfriend. The 2 of them of been together for a couple of years. They have a 65-year-old daughter and now his girlfriend is pregnant again. 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  Medical history: Patient denies any significant medical problems.  Substance abuse history: Says he's been using marijuana since he was 25 years old several times a day. Doesn't see it as being any sort of problem. Denies any other alcohol or drug abuse.  Past Psychiatric History: In 2012 he was admitted to [facility] 3 times in a row because of suicidal ideation. He was apparently started on risperidone at the time but took it for only a few months. Hasn't been on any medicine since then. For about a year after going to [facility] however he did follow up with therapy and says that he learned some good coping skills. He hasn't been in therapy now for 3 or 4 years and wants to get back into it. Has never made an actual suicide attempt. No history of violence to others.  Risk to Self:   Risk to Others:   Prior Inpatient Therapy:   Prior Outpatient Therapy:    Past Medical History:  Past Medical History  Diagnosis  Date  . Depression    History reviewed. No pertinent past surgical history. Family History: No family history on file. Family Psychiatric  History: Patient does not know of any family history of mental health or substance abuse problems Social History:  History  Alcohol Use  . Yes    Comment: occasionally     History  Drug Use  . Yes  . Special: Marijuana    Comment: Saturday, last use, occasional use     Social History   Social History  . Marital Status: Single    Spouse Name: N/A  . Number of Children: N/A  . Years of Education: N/A   Social History Main Topics  . Smoking status: Current Every Day Smoker -- 1.50 packs/day    Types: Cigarettes  . Smokeless tobacco: Never Used  . Alcohol Use: Yes     Comment: occasionally  . Drug Use: Yes    Special: Marijuana     Comment: Saturday, last use, occasional use  . Sexual Activity: Yes    Birth Control/ Protection: None   Other Topics Concern  . None   Social History Narrative   Additional Social History:    Allergies:  No Known Allergies  Labs:  Results for orders placed or performed during the hospital encounter of 06/14/15 (from the past 48 hour(s))  Urine Drug Screen, Qualitative     Status: Abnormal   Collection Time: 06/14/15 11:54 AM  Result Value Ref Range   Tricyclic, Ur Screen NONE DETECTED NONE DETECTED   Amphetamines, Ur Screen NONE DETECTED NONE DETECTED   MDMA (Ecstasy)Ur Screen NONE DETECTED NONE DETECTED   Cocaine Metabolite,Ur [place] NONE DETECTED NONE DETECTED   Opiate, Ur Screen NONE DETECTED NONE DETECTED   Phencyclidine (PCP) Ur S NONE DETECTED NONE DETECTED   Cannabinoid 50 Ng, Ur [place] POSITIVE (A) NONE DETECTED   Barbiturates, Ur Screen NONE DETECTED NONE DETECTED   Benzodiazepine, Ur Scrn NONE DETECTED NONE DETECTED   Methadone Scn, Ur NONE DETECTED NONE DETECTED    Comment: (NOTE) 287  Tricyclics, urine               Cutoff 1000 ng/mL 200  Amphetamines, urine             Cutoff 1000 ng/mL 300  MDMA (Ecstasy), urine           Cutoff 500 ng/mL 400  Cocaine Metabolite, urine       Cutoff 300 ng/mL 500  Opiate, urine                   Cutoff 300 ng/mL 600  Phencyclidine (PCP), urine      Cutoff 25 ng/mL 700  Cannabinoid, urine              Cutoff 50 ng/mL 800  Barbiturates, urine             Cutoff 200 ng/mL 900  Benzodiazepine, urine           Cutoff 200 ng/mL 1000 Methadone, urine                 Cutoff 300 ng/mL 1100 1200 The urine drug screen provides only a preliminary, unconfirmed 1300 analytical test result and should not be used for non-medical 1400 purposes. Clinical consideration and professional judgment should 1500 be applied to any positive drug screen result due to possible 1600 interfering substances. A more specific alternate chemical method 1700 must be used in order to obtain a confirmed analytical  result.  1800 Gas chromato graphy / mass spectrometry (GC/MS) is the preferred 1900 confirmatory method.   Comprehensive metabolic panel     Status: None   Collection Time: 06/14/15 12:25 PM  Result Value Ref Range   Sodium 139 135 - 145 mmol/L   Potassium 3.9 3.5 - 5.1 mmol/L    Comment: HEMOLYSIS AT THIS LEVEL MAY AFFECT RESULT   Chloride 109 101 - 111 mmol/L   CO2 24 22 - 32 mmol/L   Glucose, Bld 91 65 - 99 mg/dL   BUN 6 6 - 20 mg/dL   Creatinine, Ser 0.83 0.61 - 1.24 mg/dL   Calcium 9.4 8.9 - 10.3 mg/dL   Total Protein 7.9 6.5 - 8.1 g/dL   Albumin 4.3 3.5 - 5.0 g/dL   AST 26 15 - 41 U/L   ALT 25 17 - 63 U/L   Alkaline Phosphatase 48 38 - 126 U/L   Total Bilirubin 1.2 0.3 - 1.2 mg/dL   GFR calc non Af Amer >60 >60 mL/min   GFR calc Af Amer >60 >60 mL/min    Comment: (NOTE) The eGFR has been calculated using the CKD EPI equation. This calculation has not been validated in all clinical situations. eGFR's persistently <60 mL/min signify possible Chronic Kidney Disease.    Anion gap 6 5 - 15  Ethanol     Status: None   Collection Time: 06/14/15 12:25 PM  Result Value Ref Range   Alcohol, Ethyl (B) <5 <5 mg/dL    Comment:        LOWEST DETECTABLE LIMIT FOR SERUM ALCOHOL IS 5 mg/dL FOR MEDICAL PURPOSES ONLY   Salicylate level     Status: None   Collection Time: 06/14/15 12:25 PM  Result Value Ref Range   Salicylate Lvl <6.9 2.8 - 30.0 mg/dL  Acetaminophen level     Status: Abnormal   Collection Time: 06/14/15 12:25 PM  Result Value Ref Range    Acetaminophen (Tylenol), Serum <10 (L) 10 - 30 ug/mL    Comment:        THERAPEUTIC CONCENTRATIONS VARY SIGNIFICANTLY. A RANGE OF 10-30 ug/mL MAY BE AN EFFECTIVE CONCENTRATION FOR MANY PATIENTS. HOWEVER, SOME ARE BEST TREATED AT CONCENTRATIONS OUTSIDE THIS RANGE. ACETAMINOPHEN CONCENTRATIONS >150 ug/mL AT 4 HOURS AFTER INGESTION AND >50 ug/mL AT 12 HOURS AFTER INGESTION ARE OFTEN ASSOCIATED WITH TOXIC REACTIONS.   CBC     Status: None   Collection Time: 06/14/15  1:03 PM  Result Value Ref Range   WBC 7.5 3.8 - 10.6 K/uL   RBC 5.11 4.40 - 5.90 MIL/uL   Hemoglobin 14.5 13.0 - 18.0 g/dL   HCT 44.5 40.0 - 52.0 %   MCV 87.1 80.0 - 100.0 fL   MCH 28.4 26.0 - 34.0 pg   MCHC 32.7 32.0 - 36.0 g/dL   RDW 14.4 11.5 - 14.5 %   Platelets 210 150 - 440 K/uL    No current facility-administered medications for this encounter.   Current Outpatient Prescriptions  Medication Sig Dispense Refill  . HYDROcodone-acetaminophen (NORCO/VICODIN) 5-325 MG tablet Take 1 tablet by mouth every 4 (four) hours as needed for moderate pain. 20 tablet 0  . ondansetron (ZOFRAN ODT) 4 MG disintegrating tablet Take 1 tablet (4 mg total) by mouth every 8 (eight) hours as needed for nausea or vomiting. 20 tablet 0  . pantoprazole (PROTONIX) 40 MG tablet Take 1 tablet (40 mg total) by mouth daily. 30 tablet 1    Musculoskeletal: Strength & Muscle  Tone: within normal limits Gait & Station: normal Patient leans: N/A  Psychiatric Specialty Exam: Physical Exam  Nursing note and vitals reviewed. Constitutional: He appears well-developed and well-nourished.  HENT:  Head: Normocephalic and atraumatic.  Eyes: Conjunctivae are normal. Pupils are equal, round, and reactive to light.  Neck: Normal range of motion.  Cardiovascular: Normal rate, regular rhythm and normal heart sounds.   Respiratory: Effort normal and breath sounds normal. No respiratory distress.  GI: Soft.  Musculoskeletal: Normal range of motion.   Neurological: He is alert.  Skin: Skin is warm and dry.  Psychiatric: He has a normal mood and affect. His behavior is normal. Judgment and thought content normal.    Review of Systems  Constitutional: Negative.   HENT: Negative.   Eyes: Negative.   Respiratory: Negative.   Cardiovascular: Negative.   Gastrointestinal: Negative.   Musculoskeletal: Negative.   Skin: Negative.   Neurological: Negative.   Psychiatric/Behavioral: Positive for suicidal ideas and substance abuse. Negative for depression, hallucinations and memory loss. The patient is nervous/anxious. The patient does not have insomnia.     Blood pressure 121/86, pulse 88, temperature 98.6 F (37 C), temperature source Oral, resp. rate 18, height 5' 10"  (1.778 m), weight 88.451 kg (195 lb), SpO2 95 %.Body mass index is 27.98 kg/(m^2).  General Appearance: Casual  Eye Contact:  Good  Speech:  Clear and Coherent  Volume:  Normal  Mood:  Anxious  Affect:  Appropriate and Tearful  Thought Process:  Coherent  Orientation:  Full (Time, Place, and Person)  Thought Content:  Logical  Suicidal Thoughts:  No  Homicidal Thoughts:  No  Memory:  Immediate;   Fair Recent;   Fair Remote;   Fair  Judgement:  Fair  Insight:  Fair  Psychomotor Activity:  Normal  Concentration:  Concentration: Good  Recall:  Good  Fund of Knowledge:  Good  Language:  Good  Akathisia:  No  Handed:  Right  AIMS (if indicated):     Assets:  Communication Skills Desire for Improvement Financial Resources/Insurance Housing Physical Health Resilience  ADL's:  Intact  Cognition:  WNL  Sleep:        Treatment Plan Summary: Plan 25 year old man 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 Acted out today was suicidal threats but no actions. Convincingly states he has no actual intent plan or wish to harm himself. Affect is appropriate and reactive at this point. No sign of psychosis. Patient will not be  started on any medicine. Supportive counseling and review of treatment plan which is for him to go to [facility] and go to a walk-in evaluation and look into seeing a counselor. IVC can be discontinued. Case reviewed with emergency room doctor. Patient can be released from the ER and follow-up in the community.  Disposition: Patient does not meet criteria for psychiatric inpatient admission. Supportive therapy provided about ongoing stressors.  Alethia Berthold, MD 06/14/2015 3:04 PM

## 2015-06-14 NOTE — ED Provider Notes (Signed)
Time Seen: Approximately 1315  I have reviewed the triage notes  Chief Complaint: No chief complaint on file.   History of Present Illness: Charles Harper is a 25 y.o. male who states he's had a history of transient suicidal ideations. He states that he got in a fight with his girlfriend and became very upset today. He states he did not cope with the argument very well. He states he knows he is supposed to walk away. He states he had some transient suicidal thoughts and center group taxed stating that he was thinking about killing himself. Patient states he's had a history of this and was checked himself into facilities in order to prevent himself from harming himself. He denies any current suicidal thoughts and states he feels better and states he just wanted someone to talk to. He denies any auditory or visual hallucinations. He denies any homicidal thoughts. Patient denies any physical complaints  Past Medical History  Diagnosis Date  . Depression     There are no active problems to display for this patient.   History reviewed. No pertinent past surgical history.  History reviewed. No pertinent past surgical history.  Current Outpatient Rx  Name  Route  Sig  Dispense  Refill  . HYDROcodone-acetaminophen (NORCO/VICODIN) 5-325 MG tablet   Oral   Take 1 tablet by mouth every 4 (four) hours as needed for moderate pain.   20 tablet   0   . ondansetron (ZOFRAN ODT) 4 MG disintegrating tablet   Oral   Take 1 tablet (4 mg total) by mouth every 8 (eight) hours as needed for nausea or vomiting.   20 tablet   0   . pantoprazole (PROTONIX) 40 MG tablet   Oral   Take 1 tablet (40 mg total) by mouth daily.   30 tablet   1     Allergies:  Review of patient's allergies indicates no known allergies.  Family History: No family history on file.  Social History: Social History  Substance Use Topics  . Smoking status: Current Every Day Smoker -- 1.50 packs/day    Types:  Cigarettes  . Smokeless tobacco: Never Used  . Alcohol Use: Yes     Comment: occasionally     Review of Systems:   10 point review of systems was performed and was otherwise negative:  Constitutional: No fever Eyes: No visual disturbances ENT: No sore throat, ear pain Cardiac: No chest pain Respiratory: No shortness of breath, wheezing, or stridor Abdomen: No abdominal pain, no vomiting, No diarrhea Endocrine: No weight loss, No night sweats Extremities: No peripheral edema, cyanosis Skin: No rashes, easy bruising Neurologic: No focal weakness, trouble with speech or swollowing Urologic: No dysuria, Hematuria, or urinary frequency   Physical Exam:  ED Triage Vitals  Enc Vitals Group     BP 06/14/15 1215 121/86 mmHg     Pulse Rate 06/14/15 1215 88     Resp 06/14/15 1215 18     Temp 06/14/15 1215 98.6 F (37 C)     Temp Source 06/14/15 1215 Oral     SpO2 06/14/15 1215 95 %     Weight 06/14/15 1215 195 lb (88.451 kg)     Height 06/14/15 1215  (1.778 m)     Head Cir --      Peak Flow --      Pain Score 06/14/15 1216 0     Pain Loc --      Pain Edu? --  Excl. in GC? --     General: Awake , Alert , and Oriented times 3; GCS 15 Head: Normal cephalic , atraumatic Eyes: Pupils equal , round, reactive to light Nose/Throat: No nasal drainage, patent upper airway without erythema or exudate.  Neck: Supple, Full range of motion, No anterior adenopathy or palpable thyroid masses Lungs: Clear to ascultation without wheezes , rhonchi, or rales Heart: Regular rate, regular rhythm without murmurs , gallops , or rubs Abdomen: Soft, non tender without rebound, guarding , or rigidity; bowel sounds positive and symmetric in all 4 quadrants. No organomegaly .        Extremities: 2 plus symmetric pulses. No edema, clubbing or cyanosis Neurologic: normal ambulation, Motor symmetric without deficits, sensory intact Skin: warm, dry, no rashes   Labs:   All laboratory work  was reviewed including any pertinent negatives or positives listed below:  Labs Reviewed  ACETAMINOPHEN LEVEL - Abnormal; Notable for the following:    Acetaminophen (Tylenol), Serum <10 (*)    All other components within normal limits  URINE DRUG SCREEN, QUALITATIVE ([facility] ONLY) - Abnormal; Notable for the following:    Cannabinoid 50 Ng, Ur [place] POSITIVE (*)    All other components within normal limits  ETHANOL  SALICYLATE LEVEL  CBC  COMPREHENSIVE METABOLIC PANEL     ED Course: * Patient was seen by psychiatric services and agree that the patient is not currently suicidal risk at harming himself. Patient has no physical complaints. He does admit to marijuana usage earlier today to "" help calm him down "". Patient is been referred to local psychiatric services. He knows that if he has any suicidal thoughts he can always return here to the emergency department.    Assessment:  Passive suicidal ideation   Final Clinical Impression:   Final diagnoses:  Passive suicidal ideations     Plan: * Outpatient Patient was advised to return immediately if condition worsens. Patient was advised to follow up with their primary care physician or other specialized physicians involved in their outpatient care. The patient and/or family member/power of attorney had laboratory results reviewed at the bedside. All questions and concerns were addressed and appropriate discharge instructions were distributed by the nursing staff.            Jennye MoccasinBrian S Quigley, MD 06/14/15 1351

## 2015-06-14 NOTE — ED Notes (Signed)

## 2015-06-14 NOTE — Discharge Instructions (Signed)
Suicidal Feelings: How to Help Yourself °Suicide is the taking of one's own life. If you feel as though life is getting too tough to handle and are thinking about suicide, get help right away. To get help: °· Call your local emergency services (911 in the [place] °· Call a suicide hotline to speak with a trained counselor who understands how you are feeling. The following is a list of suicide hotlines in the [place]. For a list of hotlines in [place], visit www.suicide.org/hotlines/international/[place]-suicide-hotlines.html. °¨  1-800-273-TALK (1-800-273-8255). °¨  1-800-SUICIDE (1-800-784-2433). °¨  1-888-628-9454. This is a hotline for Spanish speakers. °¨  1-800-799-4TTY (1-800-799-4889). This is a hotline for TTY users. °¨  1-866-4-U-TREVOR (1-866-488-7386). This is a hotline for lesbian, gay, bisexual, transgender, or questioning youth. °· Contact a crisis center or a local suicide prevention center. To find a crisis center or suicide prevention center: °¨ Call your local hospital, clinic, community service organization, mental health center, social service provider, or health department. Ask for assistance in connecting to a crisis center. °¨ Visit www.suicidepreventionlifeline.org/getinvolved/locator for a list of crisis centers in the [place], or visit www.suicideprevention.ca/thinking-about-suicide/find-a-crisis-centre for a list of centers in [place]. °· Visit the following websites: °¨  National Suicide Prevention Lifeline: www.suicidepreventionlifeline.org °¨  Hopeline: www.hopeline.com °¨  American Foundation for Suicide Prevention: www.afsp.org °¨  The Trevor Project (for lesbian, gay, bisexual, transgender, or questioning youth): www.thetrevorproject.org °HOW CAN I HELP MYSELF FEEL BETTER? °· Promise yourself that you will not do anything drastic when you have suicidal feelings. Remember, there is hope. Many people have gotten through suicidal thoughts and feelings, and you will, too. You may  have gotten through them before, and this proves that you can get through them again. °· Let family, friends, teachers, or counselors know how you are feeling. Try not to isolate yourself from those who care about you. Remember, they will want to help you. Talk with someone every day, even if you do not feel sociable. Face-to-face conversation is best. °· Call a mental health professional and see one regularly. °· Visit your primary health care provider every year. °· Eat a well-balanced diet, and space your meals so you eat regularly. °· Get plenty of rest. °· Avoid alcohol and drugs, and remove them from your home. They will only make you feel worse. °· If you are thinking of taking a lot of medicine, give your medicine to someone who can give it to you one day at a time. If you are on antidepressants and are concerned you will overdose, let your health care provider know so he or she can give you safer medicines. Ask your mental health professional about the possible side effects of any medicines you are taking. °· Remove weapons, poisons, knives, and anything else that could harm you from your home. °· Try to stick to routines. Follow a schedule every day. Put self-care on your schedule. °· Make a list of realistic goals, and cross them off when you achieve them. Accomplishments give a sense of worth. °· Wait until you are feeling better before doing the things you find difficult or unpleasant. °· Exercise if you are able. You will feel better if you exercise for even a half hour each day. °· Go out in the sun or into nature. This will help you recover from depression faster. If you have a favorite place to walk, go there. °· Do the things that have always given you pleasure. Play your favorite music, read a good book, paint a picture, play your favorite instrument, or do anything   else that takes your mind off your depression if it is safe to do. °· Keep your living space well lit. °· When you are feeling well,  write yourself a letter about tips and support that you can read when you are not feeling well. °· Remember that life's difficulties can be sorted out with help. Conditions can be treated. You can work on thoughts and strategies that serve you well. °  °This information is not intended to replace advice given to you by your health care provider. Make sure you discuss any questions you have with your health care provider. °  °Document Released: 07/15/2002 Document Revised: 01/29/2014 Document Reviewed: 05/05/2013 °Elsevier Interactive Patient Education ©2016 [employer] ° °Please return immediately if condition worsens. Please contact her primary physician or the physician you were given for referral. If you have any specialist physicians involved in her treatment and plan please also contact them. Thank you for using [facility]. ° °
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
## 2016-04-20 ENCOUNTER — Emergency Department
Admission: EM | Admit: 2016-04-20 | Discharge: 2016-04-20 | Disposition: A | Discharge status: Home / Self Care | Payer: Self-pay | Attending: Emergency Medicine | Admitting: Emergency Medicine

## 2016-04-20 ENCOUNTER — Encounter: Payer: Self-pay | Admitting: Emergency Medicine

## 2016-04-20 DIAGNOSIS — F17210 Nicotine dependence, cigarettes, uncomplicated: Secondary | ICD-10-CM | POA: Insufficient documentation

## 2016-04-20 DIAGNOSIS — Z79899 Other long term (current) drug therapy: Secondary | ICD-10-CM | POA: Insufficient documentation

## 2016-04-20 DIAGNOSIS — K858 Other acute pancreatitis without necrosis or infection: Secondary | ICD-10-CM | POA: Insufficient documentation

## 2016-04-20 DIAGNOSIS — R1111 Vomiting without nausea: Secondary | ICD-10-CM

## 2016-04-20 LAB — COMPREHENSIVE METABOLIC PANEL
ALBUMIN: 4.1 g/dL (ref 3.5–5.0)
ALT: 48 U/L (ref 17–63)
ANION GAP: 5 (ref 5–15)
AST: 32 U/L (ref 15–41)
Alkaline Phosphatase: 61 U/L (ref 38–126)
BILIRUBIN TOTAL: 0.4 mg/dL (ref 0.3–1.2)
BUN: 13 mg/dL (ref 6–20)
CALCIUM: 8.9 mg/dL (ref 8.9–10.3)
CO2: 24 mmol/L (ref 22–32)
Chloride: 108 mmol/L (ref 101–111)
Creatinine, Ser: 0.85 mg/dL (ref 0.61–1.24)
Glucose, Bld: 97 mg/dL (ref 65–99)
POTASSIUM: 4.2 mmol/L (ref 3.5–5.1)
Sodium: 137 mmol/L (ref 135–145)
TOTAL PROTEIN: 7.8 g/dL (ref 6.5–8.1)

## 2016-04-20 LAB — URINALYSIS, COMPLETE (UACMP) WITH MICROSCOPIC
Bacteria, UA: NONE SEEN
Bilirubin Urine: NEGATIVE
GLUCOSE, UA: NEGATIVE mg/dL
Hgb urine dipstick: NEGATIVE
Ketones, ur: NEGATIVE mg/dL
Leukocytes, UA: NEGATIVE
Nitrite: NEGATIVE
PROTEIN: NEGATIVE mg/dL
RBC / HPF: NONE SEEN RBC/hpf (ref 0–5)
SPECIFIC GRAVITY, URINE: 1.018 (ref 1.005–1.030)
Squamous Epithelial / LPF: NONE SEEN
pH: 5 (ref 5.0–8.0)

## 2016-04-20 LAB — CBC
HEMATOCRIT: 45.4 % (ref 40.0–52.0)
HEMOGLOBIN: 15.2 g/dL (ref 13.0–18.0)
MCH: 29.1 pg (ref 26.0–34.0)
MCHC: 33.5 g/dL (ref 32.0–36.0)
MCV: 86.9 fL (ref 80.0–100.0)
Platelets: 186 10*3/uL (ref 150–440)
RBC: 5.22 MIL/uL (ref 4.40–5.90)
RDW: 14 % (ref 11.5–14.5)
WBC: 6.4 10*3/uL (ref 3.8–10.6)

## 2016-04-20 LAB — LIPASE, BLOOD: Lipase: 83 U/L — ABNORMAL HIGH (ref 11–51)

## 2016-04-20 LAB — TROPONIN I: Troponin I: <0.03 ng/mL (ref <0.03)

## 2016-04-20 MED ORDER — ONDANSETRON HCL 4 MG PO TABS: 4.0000 mg | ORAL_TABLET | Freq: Three times a day (TID) | ORAL | 0 refills | Status: DC | PRN

## 2016-04-20 MED ORDER — SODIUM CHLORIDE 0.9 % IV BOLUS (SEPSIS)
1000.0000 mL | Freq: Once | INTRAVENOUS | Status: AC
  Administered 2016-04-20: 1000 mL via INTRAVENOUS

## 2016-04-20 MED ORDER — ONDANSETRON HCL 4 MG/2ML IJ SOLN
4.0000 mg | Freq: Once | INTRAMUSCULAR | Status: AC
  Administered 2016-04-20: 4 mg via INTRAVENOUS
  Filled 2016-04-20: qty 2
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
## 2016-04-20 NOTE — Discharge Instructions (Addendum)
Although no certain cause was found for your vomiting, your exam and evaluation are reassuring in the emergency department stay.  Return to emergency room immediately for any abdominal pain, black or bloody stool, black or bloody vomiting, dizziness, passing out, chest pain, trouble breathing, or any other symptoms concerning to you.  Please follow up with her primary care doctor, of systems persist, you may be referred to gastroenterology specialist.  Avoid marijuana.  Your lipase is elevated and may indicate pancreatitis.  Eat liquid diet, avoid fatty and spicy foods as well for a few days and then advance as tolerated to normal diet.  Please consider follow up with gastroenterologist and primary care doctor.

## 2016-04-20 NOTE — ED Triage Notes (Signed)
Pt to ed with c/o vomiting x 3 days, denies diarrhea.

## 2017-01-04 ENCOUNTER — Emergency Department
Admission: EM | Admit: 2017-01-04 | Discharge: 2017-01-04 | Disposition: A | Discharge status: Home / Self Care | Payer: No Typology Code available for payment source | Attending: Emergency Medicine | Admitting: Emergency Medicine

## 2017-01-04 ENCOUNTER — Other Ambulatory Visit: Payer: Self-pay

## 2017-01-04 ENCOUNTER — Emergency Department: Payer: No Typology Code available for payment source

## 2017-01-04 ENCOUNTER — Encounter: Payer: Self-pay | Admitting: Emergency Medicine

## 2017-01-04 DIAGNOSIS — M79605 Pain in left leg: Secondary | ICD-10-CM | POA: Diagnosis not present

## 2017-01-04 DIAGNOSIS — F17210 Nicotine dependence, cigarettes, uncomplicated: Secondary | ICD-10-CM | POA: Diagnosis not present

## 2017-01-04 DIAGNOSIS — M546 Pain in thoracic spine: Secondary | ICD-10-CM | POA: Diagnosis not present

## 2017-01-04 DIAGNOSIS — M7918 Myalgia, other site: Secondary | ICD-10-CM

## 2017-01-04 MED ORDER — IBUPROFEN 800 MG PO TABS: 800.0000 mg | ORAL_TABLET | Freq: Three times a day (TID) | ORAL | 0 refills | Status: DC | PRN

## 2017-01-04 MED ORDER — METHOCARBAMOL 500 MG PO TABS
500.0000 mg | ORAL_TABLET | Freq: Four times a day (QID) | ORAL | 0 refills | Status: DC
Start: 2017-01-04 — End: 2018-09-30

## 2017-01-04 NOTE — Discharge Instructions (Signed)
To follow-up with your regular doctor or the acute care if you are not better in 7-10 days, you have been given a prescription for ibuprofen and Robaxin for musculoskeletal pain, your x-rays are negative for any fractures, use ice on any areas that hurt if you are worsening in 3 days she can use wet heat followed by ice, please return to the emergency department
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
## 2017-03-20 ENCOUNTER — Emergency Department
Admission: EM | Admit: 2017-03-20 | Discharge: 2017-03-20 | Disposition: A | Discharge status: Home / Self Care | Payer: Self-pay | Attending: Emergency Medicine | Admitting: Emergency Medicine

## 2017-03-20 ENCOUNTER — Other Ambulatory Visit: Payer: Self-pay

## 2017-03-20 DIAGNOSIS — R1012 Left upper quadrant pain: Secondary | ICD-10-CM | POA: Insufficient documentation

## 2017-03-20 DIAGNOSIS — R112 Nausea with vomiting, unspecified: Secondary | ICD-10-CM | POA: Insufficient documentation

## 2017-03-20 DIAGNOSIS — F17210 Nicotine dependence, cigarettes, uncomplicated: Secondary | ICD-10-CM | POA: Insufficient documentation

## 2017-03-20 DIAGNOSIS — Z79899 Other long term (current) drug therapy: Secondary | ICD-10-CM | POA: Insufficient documentation

## 2017-03-20 LAB — COMPREHENSIVE METABOLIC PANEL
ALT: 36 U/L (ref 17–63)
ANION GAP: 8 (ref 5–15)
AST: 27 U/L (ref 15–41)
Albumin: 4.1 g/dL (ref 3.5–5.0)
Alkaline Phosphatase: 63 U/L (ref 38–126)
BUN: 10 mg/dL (ref 6–20)
CO2: 28 mmol/L (ref 22–32)
Calcium: 8.9 mg/dL (ref 8.9–10.3)
Chloride: 103 mmol/L (ref 101–111)
Creatinine, Ser: 0.86 mg/dL (ref 0.61–1.24)
GFR calc non Af Amer: >60 mL/min (ref >60)
GLUCOSE: 83 mg/dL (ref 65–99)
POTASSIUM: 3.7 mmol/L (ref 3.5–5.1)
SODIUM: 139 mmol/L (ref 135–145)
Total Bilirubin: 0.6 mg/dL (ref 0.3–1.2)
Total Protein: 7.6 g/dL (ref 6.5–8.1)

## 2017-03-20 LAB — CBC
HEMATOCRIT: 46.6 % (ref 40.0–52.0)
Hemoglobin: 15 g/dL (ref 13.0–18.0)
MCH: 28.2 pg (ref 26.0–34.0)
MCHC: 32.3 g/dL (ref 32.0–36.0)
MCV: 87.5 fL (ref 80.0–100.0)
Platelets: 202 10*3/uL (ref 150–440)
RBC: 5.33 MIL/uL (ref 4.40–5.90)
RDW: 15 % — ABNORMAL HIGH (ref 11.5–14.5)
WBC: 6.8 10*3/uL (ref 3.8–10.6)

## 2017-03-20 LAB — LIPASE, BLOOD: LIPASE: 47 U/L (ref 11–51)

## 2017-03-20 MED ORDER — FAMOTIDINE 20 MG PO TABS: 20.0000 mg | ORAL_TABLET | Freq: Two times a day (BID) | ORAL | 0 refills | Status: DC

## 2017-03-20 MED ORDER — ONDANSETRON 4 MG PO TBDP
ORAL_TABLET | ORAL | Status: AC
  Filled 2017-03-20: qty 1

## 2017-03-20 MED ORDER — FAMOTIDINE 20 MG PO TABS
40.0000 mg | ORAL_TABLET | Freq: Once | ORAL | Status: AC
  Administered 2017-03-20: 40 mg via ORAL
  Filled 2017-03-20: qty 2

## 2017-03-20 MED ORDER — ONDANSETRON 4 MG PO TBDP: 4.0000 mg | ORAL_TABLET | Freq: Three times a day (TID) | ORAL | 0 refills | Status: DC | PRN

## 2017-03-20 MED ORDER — ONDANSETRON 4 MG PO TBDP
8.0000 mg | ORAL_TABLET | Freq: Once | ORAL | Status: AC
  Administered 2017-03-20: 8 mg via ORAL
  Filled 2017-03-20: qty 2

## 2017-03-20 NOTE — ED Provider Notes (Signed)
[facility] Emergency Department Provider Note  ____________________________________________  Time seen: Approximately 8:25 PM  I have reviewed the triage vital signs and the nursing notes.   HISTORY  Chief Complaint Emesis    HPI Charles Harper is a 27 y.o. male who complains of nausea and vomiting for the past 3 days after eating some leftover [place] food. He's had intermittent vomiting for the past couple of days although it is better when he drinks ginger ale. Also complains of left upper quadrant pain that is nonradiating, mild to moderate intensity, aching, no aggravating or alleviating factors. Denies fever chills body aches joint pain chest pain or shortness of breath.     Past Medical History:  Diagnosis Date  . Depression      Patient Active Problem List   Diagnosis Date Noted  . 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 Marijuana abuse 06/14/2015  . Dysthymia 06/14/2015  . Suicidal ideation 06/14/2015     History reviewed. No pertinent surgical history.   Prior to Admission medications   Medication Sig Start Date End Date Taking? Authorizing Provider  famotidine (PEPCID) 20 MG tablet Take 1 tablet (20 mg total) by mouth 2 (two) times daily. 03/20/17   Sharman Cheek, MD  ibuprofen (ADVIL,MOTRIN) 800 MG tablet Take 1 tablet (800 mg total) by mouth every 8 (eight) hours as needed. 01/04/17   Fisher, Roselyn Bering, PA-C  methocarbamol (ROBAXIN) 500 MG tablet Take 1 tablet (500 mg total) by mouth 4 (four) times daily. 01/04/17   Sherrie Mustache Roselyn Bering, PA-C  ondansetron (ZOFRAN ODT) 4 MG disintegrating tablet Take 1 tablet (4 mg total) by mouth every 8 (eight) hours as needed for nausea or vomiting. 03/20/17   Sharman Cheek, MD  ondansetron (ZOFRAN) 4 MG tablet Take 1 tablet (4 mg total) by mouth every 8 (eight) hours as needed for nausea or vomiting. 04/20/16   Governor Rooks, MD     Allergies Patient has  no known allergies.   History reviewed. No pertinent family history.  Social History Social History   Tobacco Use  . Smoking status: Current Every Day Smoker    Packs/day: 1.50    Types: Cigarettes  . Smokeless tobacco: Never Used  Substance Use Topics  . Alcohol use: Yes    Comment: occasionally  . Drug use: Yes    Types: Marijuana    Comment: Saturday, last use, occasional use    Review of Systems  Constitutional:   No fever or chills.  ENT:   No sore throat. No rhinorrhea. Cardiovascular:   No chest pain or syncope. Respiratory:   No dyspnea or cough. Gastrointestinal:   Positive as above for abdominal pain and vomiting. No constipation or diarrhea  Musculoskeletal:   Negative for focal pain or swelling All other systems reviewed and are negative except as documented above in ROS and HPI.  ____________________________________________   PHYSICAL EXAM:  VITAL SIGNS: ED Triage Vitals  Enc Vitals Group     BP 03/20/17 1756 114/63     Pulse Rate 03/20/17 1756 87     Resp 03/20/17 1756 18     Temp 03/20/17 1756 98.8 F (37.1 C)     Temp Source 03/20/17 1756 Oral     SpO2 03/20/17 1756 98 %     Weight 03/20/17 1757 202 lb (91.6 kg)     Height 03/20/17 1757 5\' 11"  (1.803 m)     Head Circumference --  Peak Flow --      Pain Score 03/20/17 1802 2     Pain Loc --      Pain Edu? --      Excl. in GC? --     Vital signs reviewed, nursing assessments reviewed.   Constitutional:   Alert and oriented. Well appearing and in no distress. Eyes:   No scleral icterus.  EOMI. No nystagmus. No conjunctival pallor. PERRL. ENT   Head:   Normocephalic and atraumatic.   Nose:   No congestion/rhinnorhea.    Mouth/Throat:   MMM, no pharyngeal erythema. No peritonsillar mass.    Neck:   No meningismus. Full ROM. Hematological/Lymphatic/Immunilogical:   No cervical lymphadenopathy. Cardiovascular:   RRR. Symmetric bilateral radial and DP pulses.  No murmurs.   Respiratory:   Normal respiratory effort without tachypnea/retractions. Breath sounds are clear and equal bilaterally. No wheezes/rales/rhonchi. Gastrointestinal:   Soft with mild left upper quadrant tenderness. Non distended. There is no CVA tenderness.  No rebound, rigidity, or guarding. Genitourinary:   deferred Musculoskeletal:   Normal range of motion in all extremities. No joint effusions.  No lower extremity tenderness.  No edema. Neurologic:   Normal speech and language.  Motor grossly intact. No acute focal neurologic deficits are appreciated.  Skin:    Skin is warm, dry and intact. No rash noted.  No petechiae, purpura, or bullae.  ____________________________________________    LABS (pertinent positives/negatives) (all labs ordered are listed, but only abnormal results are displayed) Labs Reviewed  CBC - Abnormal; Notable for the following components:      Result Value   RDW 15.0 (*)    All other components within normal limits  LIPASE, BLOOD  COMPREHENSIVE METABOLIC PANEL  URINALYSIS, COMPLETE (UACMP) WITH MICROSCOPIC   ____________________________________________   EKG    ____________________________________________    RADIOLOGY  No results found.  ____________________________________________   PROCEDURES Procedures  ____________________________________________    CLINICAL IMPRESSION / ASSESSMENT AND PLAN / ED COURSE  Pertinent labs & imaging results that were available during my care of the patient were reviewed by me and considered in my medical decision making (see chart for details).   Patient well appearing no acute distress, presents with normal vital signs. Labs are unremarkable. Exam is reassuring. Consistent with gastritis, likely food borne illness versus viral syndrome. Antibiotics not necessary at this time, no indication for advanced imaging.Considering the patient's symptoms, medical history, and physical examination today, I have low  suspicion for cholecystitis or biliary pathology, pancreatitis, perforation or bowel obstruction, hernia, intra-abdominal abscess, AAA or dissection, volvulus or intussusception, mesenteric ischemia, or appendicitis.  Symptomatic management with Pepcid and Zofran. Patient tolerating oral intake in the ED. Suitable for outpatient follow-up.      ____________________________________________   FINAL CLINICAL IMPRESSION(S) / ED DIAGNOSES    Final diagnoses:  Non-intractable vomiting with nausea, unspecified vomiting type  LUQ pain       Portions of this note were generated with dragon dictation software. Dictation errors may occur despite best attempts at proofreading.    Sharman CheekStafford, Kessler Kopinski, MD 03/20/17 2027

## 2017-03-20 NOTE — ED Notes (Signed)
Pt states nausea and vomiting started Sunday night. Diarrhea starting today with 2 episodes. Pt states he thought he ate some bad food on Sunday and that he possibly had food poison.

## 2017-03-20 NOTE — ED Triage Notes (Signed)
Pt arrives to ED c/o of vomiting x 2 days. Ate chinese food and thought food poisoning. States not holding food down, able to keep ginger ale down. LUQ belly pain "not major pain". Denies diarrhea.

## 2017-03-20 NOTE — ED Notes (Signed)
Informed RN that patient has been roomed and is ready for evaluation.  Patient in NAD at this time and call bell placed within reach.   

## 2017-04-11 ENCOUNTER — Other Ambulatory Visit: Payer: Self-pay

## 2017-04-11 ENCOUNTER — Emergency Department
Admission: EM | Admit: 2017-04-11 | Discharge: 2017-04-11 | Disposition: A | Discharge status: Home / Self Care | Payer: Self-pay | Attending: Emergency Medicine | Admitting: Emergency Medicine

## 2017-04-11 DIAGNOSIS — F17210 Nicotine dependence, cigarettes, uncomplicated: Secondary | ICD-10-CM | POA: Insufficient documentation

## 2017-04-11 DIAGNOSIS — Z79899 Other long term (current) drug therapy: Secondary | ICD-10-CM | POA: Insufficient documentation

## 2017-04-11 DIAGNOSIS — J029 Acute pharyngitis, unspecified: Secondary | ICD-10-CM | POA: Insufficient documentation

## 2017-04-11 DIAGNOSIS — R1111 Vomiting without nausea: Secondary | ICD-10-CM

## 2017-04-11 DIAGNOSIS — R1013 Epigastric pain: Secondary | ICD-10-CM | POA: Insufficient documentation

## 2017-04-11 LAB — GROUP A STREP BY PCR: Group A Strep by PCR: NOT DETECTED

## 2017-04-11 MED ORDER — METOCLOPRAMIDE HCL 10 MG PO TABS
10.0000 mg | ORAL_TABLET | Freq: Once | ORAL | Status: AC
  Administered 2017-04-11: 10 mg via ORAL
  Filled 2017-04-11: qty 1

## 2017-04-11 MED ORDER — FAMOTIDINE 40 MG PO TABS: 40.0000 mg | ORAL_TABLET | Freq: Every day | ORAL | 1 refills | Status: DC

## 2017-04-11 MED ORDER — METOCLOPRAMIDE HCL 5 MG PO TABS: 5.0000 mg | ORAL_TABLET | Freq: Three times a day (TID) | ORAL | 0 refills | Status: DC | PRN

## 2017-04-11 NOTE — ED Triage Notes (Signed)
Pt states that he has been vomiting for 3 days and now has a sore throat - vomited 7 times in 24 hours

## 2017-04-11 NOTE — ED Notes (Signed)
Resent 3 rd specimen to lab and notified Morrell RiddleKira that specimen was coming

## 2017-04-11 NOTE — ED Notes (Signed)
Pt reports has some stomach acid that has caused vomiting and a sore throat. Pt reports has been going on for 2 days. Denies fevers, diarrhea or other symptoms.

## 2017-04-11 NOTE — Discharge Instructions (Addendum)
Your symptoms may represent heartburn and reflux disease. Your throat test/culture is pending. Follow-up with your provider or return as needed. Take the prescription meds as directed. We will call with results.

## 2017-04-11 NOTE — ED Provider Notes (Signed)
[facility] Emergency Department Provider Note ____________________________________________  Time seen: 1345  I have reviewed the triage vital signs and the nursing notes.  HISTORY  Chief Complaint  Emesis and Sore Throat  HPI Charles Harper is a 27 y.o. male presents himself to the ED with complaints of 3 days of intermittent vomiting.  He also describes now a sore throat.  According to the patient he has had nonbilious nonbloody vomiting 7 times in the last 24 hours.  He was seen here about 3 weeks prior for similar complaint.  At the time he was started on famotidine and given a prescription for Zofran.  He reports limited change to his symptoms with those medications.  He does localize some mild epigastric burning at this time.  He is without any diarrhea, constipation, chest pain, hematochezia, or fevers.  Past Medical History:  Diagnosis Date  . Depression     Patient Active Problem List   Diagnosis Date Noted  . 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 Marijuana abuse 06/14/2015  . Dysthymia 06/14/2015  . Suicidal ideation 06/14/2015    History reviewed. No pertinent surgical history.  Prior to Admission medications   Medication Sig Start Date End Date Taking? Authorizing Provider  famotidine (PEPCID) 40 MG tablet Take 1 tablet (40 mg total) by mouth daily. 04/11/17 06/10/17  Hai Grabe, Charlesetta Ivory, PA-C  ibuprofen (ADVIL,MOTRIN) 800 MG tablet Take 1 tablet (800 mg total) by mouth every 8 (eight) hours as needed. 01/04/17   Fisher, Roselyn Bering, PA-C  methocarbamol (ROBAXIN) 500 MG tablet Take 1 tablet (500 mg total) by mouth 4 (four) times daily. 01/04/17   Fisher, Roselyn Bering, PA-C  metoCLOPramide (REGLAN) 5 MG tablet Take 1 tablet (5 mg total) by mouth every 8 (eight) hours as needed for nausea or vomiting. 04/11/17   Christiane Sistare, Charlesetta Ivory, PA-C  ondansetron (ZOFRAN ODT) 4 MG disintegrating tablet Take 1 tablet (4 mg  total) by mouth every 8 (eight) hours as needed for nausea or vomiting. 03/20/17   Sharman Cheek, MD  ondansetron (ZOFRAN) 4 MG tablet Take 1 tablet (4 mg total) by mouth every 8 (eight) hours as needed for nausea or vomiting. 04/20/16   Governor Rooks, MD    Allergies Patient has no known allergies.  No family history on file.  Social History Social History   Tobacco Use  . Smoking status: Current Every Day Smoker    Packs/day: 0.50    Types: Cigarettes  . Smokeless tobacco: Never Used  Substance Use Topics  . Alcohol use: Yes    Comment: occasionally  . Drug use: Yes    Types: Marijuana    Comment: last used 2 weeks ago    Review of Systems  Constitutional: Negative for fever. Eyes: Negative for visual changes. ENT: Negative for sore throat. Cardiovascular: Negative for chest pain. Respiratory: Negative for shortness of breath. Gastrointestinal: positive for epigastric abdominal pain and vomiting. Denies constipation or diarrhea. Genitourinary: Negative for dysuria. Musculoskeletal: Negative for back pain. Skin: Negative for rash. Neurological: Negative for headaches, focal weakness or numbness. ____________________________________________  PHYSICAL EXAM:  VITAL SIGNS: ED Triage Vitals  Enc Vitals Group     BP 04/11/17 1212 134/76     Pulse Rate 04/11/17 1212 98     Resp 04/11/17 1212 15     Temp 04/11/17 1212 98.1 F (36.7 C)     Temp Source 04/11/17 1212 Oral     SpO2 04/11/17 1212  97 %     Weight 04/11/17 1213 187 lb (84.8 kg)     Height 04/11/17 1213 5\' 10"  (1.778 m)     Head Circumference --      Peak Flow --      Pain Score 04/11/17 1213 7     Pain Loc --      Pain Edu? --      Excl. in GC? --     Constitutional: Alert and oriented. Well appearing and in no distress. Head: Normocephalic and atraumatic. Eyes: Conjunctivae are normal. PERRL. Normal extraocular movements Ears: Canals clear. TMs intact bilaterally. Nose: No  congestion/rhinorrhea/epistaxis. Mouth/Throat: Mucous membranes are moist.  Uvula is midline oropharynx is generally erythematous.  The patient's tonsils are enlarged, but without any obvious exudates. Neck: Supple. No thyromegaly. Hematological/Lymphatic/Immunological: No cervical lymphadenopathy. Cardiovascular: Normal rate, regular rhythm. Normal distal pulses. Respiratory: Normal respiratory effort. No wheezes/rales/rhonchi. Gastrointestinal: Soft and nontender except some very mild epigastric tenderness.. No distention or rebound, guarding, or rigidity.  Normoactive bowel sounds x4. ____________________________________________   LABS (pertinent positives/negatives)  Labs Reviewed  GROUP A STREP BY PCR - Abnormal; Notable for the following components:      Result Value   Group A Strep by PCR   (*)    Value: INVALID, UNABLE TO DETERMINE THE PRESENCE OF TARGET DNA DUE TO SPECIMEN INTEGRITY. RECOLLECTION REQUESTED.   All other components within normal limits  ____________________________________________  PROCEDURES  Procedures Reglan 10 mg PO ____________________________________________  INITIAL IMPRESSION / ASSESSMENT AND PLAN / ED COURSE  Patient with ED evaluation of sore throat and vomiting without nausea.  The patient's exam is overall benign at this time.  He reports improvement of his nausea with the metoclopramide that was given in the ED.  We have had to recollect his strep test for the third time during his ED course.  Patient will discharge at this time with a prescription for Reglan and famotidine.  We would notify the patient via telephone if his results warrant treatment with antibiotics.  We have apologized the patient for the delay from the lab and repeated collections.  The patient is given a work note for today as requested.  Patient is overall reassured and thankful for the care given. ____________________________________________  FINAL CLINICAL IMPRESSION(S) / ED  DIAGNOSES  Final diagnoses:  Sore throat  Non-intractable vomiting without nausea, unspecified vomiting type      Lissa HoardMenshew, Sonya Gunnoe V Bacon, PA-C 04/11/17 1705    Sharman CheekStafford, Phillip, MD 04/12/17 [phone]

## 2017-04-11 NOTE — ED Notes (Signed)
Lab called and reported a second invalid strep and requesting another. Sam, RN made aware.

## 2018-09-30 ENCOUNTER — Encounter: Payer: Self-pay | Admitting: Emergency Medicine

## 2018-09-30 ENCOUNTER — Other Ambulatory Visit: Payer: Self-pay

## 2018-09-30 ENCOUNTER — Emergency Department: Payer: Self-pay

## 2018-09-30 ENCOUNTER — Emergency Department
Admission: EM | Admit: 2018-09-30 | Discharge: 2018-09-30 | Disposition: A | Discharge status: Home / Self Care | Payer: Self-pay | Attending: Student | Admitting: Student

## 2018-09-30 DIAGNOSIS — Z79899 Other long term (current) drug therapy: Secondary | ICD-10-CM | POA: Insufficient documentation

## 2018-09-30 DIAGNOSIS — F17210 Nicotine dependence, cigarettes, uncomplicated: Secondary | ICD-10-CM | POA: Insufficient documentation

## 2018-09-30 DIAGNOSIS — N3090 Cystitis, unspecified without hematuria: Secondary | ICD-10-CM | POA: Insufficient documentation

## 2018-09-30 LAB — URINALYSIS, COMPLETE (UACMP) WITH MICROSCOPIC
Bacteria, UA: NONE SEEN
Bilirubin Urine: NEGATIVE
Glucose, UA: NEGATIVE mg/dL
Hgb urine dipstick: NEGATIVE
Ketones, ur: NEGATIVE mg/dL
Leukocytes,Ua: NEGATIVE
Nitrite: NEGATIVE
Protein, ur: NEGATIVE mg/dL
Specific Gravity, Urine: 1.005 (ref 1.005–1.030)
Squamous Epithelial / LPF: NONE SEEN (ref 0–5)
pH: 7 (ref 5.0–8.0)

## 2018-09-30 MED ORDER — PHENAZOPYRIDINE HCL 200 MG PO TABS: 200.0000 mg | ORAL_TABLET | Freq: Three times a day (TID) | ORAL | 0 refills | Status: DC | PRN

## 2018-09-30 MED ORDER — SULFAMETHOXAZOLE-TRIMETHOPRIM 800-160 MG PO TABS: 1.0000 | ORAL_TABLET | Freq: Two times a day (BID) | ORAL | 0 refills | Status: DC

## 2018-09-30 NOTE — ED Provider Notes (Signed)
[facility] Emergency Department Provider Note   ____________________________________________   First MD Initiated Contact with Patient 09/30/18 0945     (approximate)  I have reviewed the triage vital signs and the nursing notes.   HISTORY  Chief Complaint Dysuria   HPI Charles Harper is a 28 y.o. male presents to the ED with complaint of dysuria for approximately 4 to 5 days.  Patient states that he feels as if he is not fully emptying his bladder.  He he denies any hematuria or penile discharge.  To his knowledge he has not been exposed to any STIs.  He denies any fever, chills, nausea or vomiting.  There is been no history of kidney stones.      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 No pertinent surgical history.  Prior to Admission medications   Medication Sig Start Date End Date Taking? Authorizing Provider  phenazopyridine (PYRIDIUM) 200 MG tablet Take 1 tablet (200 mg total) by mouth 3 (three) times daily as needed for pain. 09/30/18 09/30/19  Tommi Rumps, PA-C  sulfamethoxazole-trimethoprim (BACTRIM DS) 800-160 MG tablet Take 1 tablet by mouth 2 (two) times daily. 09/30/18   Tommi Rumps, PA-C  famotidine (PEPCID) 40 MG tablet Take 1 tablet (40 mg total) by mouth daily. 04/11/17 09/30/18  Menshew, Charlesetta Ivory, PA-C  metoCLOPramide (REGLAN) 5 MG tablet Take 1 tablet (5 mg total) by mouth every 8 (eight) hours as needed for nausea or vomiting. 04/11/17 09/30/18  Menshew, Charlesetta Ivory, PA-C    Allergies Patient has no known allergies.  No family history on file.  Social History Social History   Tobacco Use   Smoking status: Current Every Day Smoker    Packs/day: 0.50   Types: Cigarettes   Smokeless tobacco: Never Used  Substance Use Topics   Alcohol use: Yes    Comment: occasionally   Drug use: Yes    Types: Marijuana    Comment: last used 2 weeks ago    Review of Systems Constitutional: No fever/chills Cardiovascular: Denies chest pain. Respiratory: Denies shortness of breath. Gastrointestinal: No abdominal pain.  No nausea, no vomiting.  Genitourinary: Positive for dysuria. Musculoskeletal: Negative for muscle aches. Skin: Negative for rash. Neurological: Negative for headaches, focal weakness or numbness. ___________________________________________   PHYSICAL EXAM:  VITAL SIGNS: ED Triage Vitals  Enc Vitals Group     BP 09/30/18 0926 132/86     Pulse Rate 09/30/18 0926 84     Resp --      Temp 09/30/18 0926 98.3 F (36.8 C)     Temp Source 09/30/18 0926 Oral     SpO2 09/30/18 0926 100 %     Weight 09/30/18 0928 186 lb 15.2 oz (84.8 kg)     Height --      Head Circumference --      Peak Flow --      Pain Score 09/30/18 0928 0     Pain Loc --      Pain Edu? --      Excl. in GC? --    Constitutional: Alert and oriented. Well appearing and in no acute distress. Eyes: Conjunctivae are normal.  Head: Atraumatic. Neck: No stridor.  Cardiovascular: Normal rate, regular rhythm. Grossly normal heart sounds.  Good peripheral circulation. Respiratory: Normal respiratory effort.  No retractions. Lungs CTAB. Gastrointestinal: Soft and nontender. No distention. No CVA tenderness. Musculoskeletal: Moves upper and lower extremities without any difficulty normal gait was noted. Neurologic:  Normal speech and language. No gross focal neurologic deficits are appreciated. No gait instability. Skin:  Skin is warm, dry and intact. No rash noted. Psychiatric: Mood and affect are normal. Speech and behavior are normal.  ____________________________________________   LABS (all labs ordered are listed, but only abnormal results are  displayed)  Labs Reviewed  URINALYSIS, COMPLETE (UACMP) WITH MICROSCOPIC - Abnormal; Notable for the following components:      Result Value   Color, Urine STRAW (*)    APPearance CLEAR (*)    All other components within normal limits  GC/CHLAMYDIA PROBE AMP     RADIOLOGY   Official radiology report(s): Ct Renal Stone Study  Result Date: 09/30/2018 CLINICAL DATA:  Dysuria EXAM: CT ABDOMEN AND PELVIS WITHOUT CONTRAST TECHNIQUE: Multidetector CT imaging of the abdomen and pelvis was performed following the standard protocol without oral or IV contrast. COMPARISON:  October 24, 2013 FINDINGS: Lower chest: Lung bases are clear. Hepatobiliary: No focal liver lesions are evident on this noncontrast enhanced study. Gallbladder wall is not appreciably thickened. There is no biliary duct dilatation. Pancreas: No pancreatic mass or inflammatory focus evident. Spleen: No splenic lesions apparent. Adrenals/Urinary Tract: Adrenals bilaterally appear unremarkable. Kidneys bilaterally show no evident mass or hydronephrosis on either side. There is no evident renal or ureteral calculus on either side. The urinary bladder is midline. The wall of the urinary bladder appears mildly thickened. Stomach/Bowel: There is no appreciable bowel wall or mesenteric thickening. Terminal ileum appears unremarkable. No evident bowel obstruction. There is no appreciable free air or portal venous air. Vascular/Lymphatic: There is no abdominal aortic aneurysm. No vascular lesions are evident on this noncontrast enhanced study. There is no appreciable adenopathy in the abdomen or pelvis. Subcentimeter inguinal lymph nodes are considered nonspecific. Reproductive: Prostate and seminal vesicles are normal in size and contour. No pelvic masses are evident. Other: Appendix appears normal. There is no evident abscess or ascites in the abdomen or pelvis. Musculoskeletal: No blastic or lytic bone lesions evident. No intramuscular or abdominal  wall lesions. IMPRESSION: 1. Mild thickening of the urinary bladder wall. Question a degree of cystitis. 2.  No hydronephrosis on either side.  No renal or ureteral calculi. 3. No evident bowel obstruction. Appendix appears normal. No abscess in the abdomen or pelvis. Electronically Signed   By: Lowella Grip III M.D.   On: 09/30/2018 11:49    ____________________________________________   PROCEDURES  Procedure(s) performed (including Critical Care):  Procedures   ____________________________________________   INITIAL IMPRESSION / ASSESSMENT AND PLAN / ED COURSE  As part of my medical decision making, I reviewed the following data within the electronic MEDICAL RECORD NUMBER Notes from prior ED visits and [place] Controlled Substance Database     Clinical Course as of Sep 30 1215  Tue Sep 30, 2018  1058 GC/Chlamydia Probe Amp [RS]    Clinical Course User Index [RS] Johnn Hai, [place]   28 year old male presents to the ED with complaint of dysuria for the last 4 to 5 days.  Patient states he has been going frequently and does not feel like he is emptying his bladder.  He denies any fever chills and has not had a previous history of UTIs.  He denies  any penile discharge and states he has not been exposed to STIs.  Urinalysis was unremarkable with the exception of a small amount of leukocytes.  Bladder scan was performed.  CT scan did show bladder wall thickening suggestive of cystitis.  Patient was placed on antibiotics and is aware that he needs to finish the entire course.  He was also placed on Pyridium 200 mg 3 times daily to help with his frequency as he was noted to be in the bathroom multiple times while in the ED.  Patient is to follow-up with urologist listed on his discharge papers for work-up and to make sure that this infection is cleared.  Patient is aware that if his STI panel comes back positive he will get a phone  call.  ____________________________________________   FINAL CLINICAL IMPRESSION(S) / ED DIAGNOSES  Final diagnoses:  Cystitis     ED Discharge Orders         Ordered    sulfamethoxazole-trimethoprim (BACTRIM DS) 800-160 MG tablet  2 times daily     09/30/18 1208    phenazopyridine (PYRIDIUM) 200 MG tablet  3 times daily PRN     09/30/18 1208           Note:  This document was prepared using Dragon voice recognition software and may include unintentional dictation errors.    Tommi RumpsSummers, Shearon Clonch L, PA-C 09/30/18 1217    Miguel AschoffMonks, Sarah L., MD 09/30/18 [phone]

## 2018-09-30 NOTE — Discharge Instructions (Signed)
Call make an appointment with the urologist listed on your discharge papers.  His contact information and address are listed.  Increase fluids.  Begin taking antibiotics as directed until completely finished.  There is also medication called Pyridium that is 3 times a day.  This medication will help with your urinary frequency however it will turn your urine a bright fluorescent yellow/orange.  This is temporary.

## 2018-09-30 NOTE — ED Triage Notes (Signed)
C/O urinary frequency x 4-5 days.

## 2018-09-30 NOTE — ED Notes (Signed)
Bladder scan -104mL Pt up to toilet at this time

## 2018-09-30 NOTE — ED Notes (Signed)
Pt reports that he has pain/burning with urination - he is unable to empty bladder fully - denies STD exposure

## 2018-10-03 LAB — GC/CHLAMYDIA PROBE AMP
Chlamydia trachomatis, NAA: NEGATIVE
Neisseria Gonorrhoeae by PCR: NEGATIVE

## 2018-11-20 ENCOUNTER — Other Ambulatory Visit: Payer: Self-pay

## 2018-11-20 DIAGNOSIS — Z20822 Contact with and (suspected) exposure to covid-19: Secondary | ICD-10-CM

## 2018-11-21 LAB — NOVEL CORONAVIRUS, NAA: SARS-CoV-2, NAA: NOT DETECTED

## 2018-12-31 ENCOUNTER — Encounter: Payer: Self-pay | Admitting: Emergency Medicine

## 2018-12-31 ENCOUNTER — Other Ambulatory Visit: Payer: Self-pay

## 2018-12-31 ENCOUNTER — Emergency Department: Payer: Self-pay

## 2018-12-31 ENCOUNTER — Emergency Department
Admission: EM | Admit: 2018-12-31 | Discharge: 2018-12-31 | Disposition: A | Discharge status: Home / Self Care | Payer: Self-pay | Attending: Emergency Medicine | Admitting: Emergency Medicine

## 2018-12-31 DIAGNOSIS — Y93H3 Activity, building and construction: Secondary | ICD-10-CM | POA: Insufficient documentation

## 2018-12-31 DIAGNOSIS — S0990XA Unspecified injury of head, initial encounter: Secondary | ICD-10-CM

## 2018-12-31 DIAGNOSIS — F1210 Cannabis abuse, uncomplicated: Secondary | ICD-10-CM | POA: Insufficient documentation

## 2018-12-31 DIAGNOSIS — Y9269 Other specified industrial and construction area as the place of occurrence of the external cause: Secondary | ICD-10-CM | POA: Insufficient documentation

## 2018-12-31 DIAGNOSIS — S022XXA Fracture of nasal bones, initial encounter for closed fracture: Secondary | ICD-10-CM

## 2018-12-31 DIAGNOSIS — F17210 Nicotine dependence, cigarettes, uncomplicated: Secondary | ICD-10-CM | POA: Insufficient documentation

## 2018-12-31 DIAGNOSIS — Y990 Civilian activity done for income or pay: Secondary | ICD-10-CM | POA: Insufficient documentation

## 2018-12-31 DIAGNOSIS — S0083XA Contusion of other part of head, initial encounter: Secondary | ICD-10-CM

## 2018-12-31 DIAGNOSIS — W01198A Fall on same level from slipping, tripping and stumbling with subsequent striking against other object, initial encounter: Secondary | ICD-10-CM | POA: Insufficient documentation

## 2018-12-31 DIAGNOSIS — S025XXB Fracture of tooth (traumatic), initial encounter for open fracture: Secondary | ICD-10-CM

## 2018-12-31 MED ORDER — HYDROCODONE-ACETAMINOPHEN 5-325 MG PO TABS
1.0000 | ORAL_TABLET | Freq: Once | ORAL | Status: AC
  Administered 2018-12-31: 1 via ORAL
  Filled 2018-12-31: qty 1

## 2018-12-31 MED ORDER — AMOXICILLIN 500 MG PO CAPS: 500.0000 mg | ORAL_CAPSULE | Freq: Three times a day (TID) | ORAL | 0 refills | Status: DC

## 2018-12-31 MED ORDER — ONDANSETRON 4 MG PO TBDP: 4.0000 mg | ORAL_TABLET | Freq: Three times a day (TID) | ORAL | 0 refills | Status: AC | PRN

## 2018-12-31 MED ORDER — HYDROCODONE-ACETAMINOPHEN 5-325 MG PO TABS: 1.0000 | ORAL_TABLET | Freq: Three times a day (TID) | ORAL | 0 refills | Status: AC | PRN

## 2018-12-31 MED ORDER — ONDANSETRON 4 MG PO TBDP
4.0000 mg | ORAL_TABLET | Freq: Once | ORAL | Status: AC
  Administered 2018-12-31: 4 mg via ORAL
  Filled 2018-12-31: qty 1

## 2018-12-31 NOTE — ED Notes (Signed)
This RN attempted to contact owner of patients employer, Teena Irani to find out if any testing needs to be performed for [facility] case.  Unable to reach anyone at this time; voicemail left to receive call back.

## 2018-12-31 NOTE — ED Triage Notes (Signed)
Here for 3-4 fall.  Pt started to catch self with hand but face/head hit wood.  Reports + LOC. Denies neck pain. Pain to left side of face. No major swelling at this time.  C/o severe pain to head as well.  No vomiting. No blood thinners.  Pain over zygomatic bone on left on palpation.

## 2018-12-31 NOTE — Discharge Instructions (Addendum)
You are being treated for an open tooth fracture with exposed pulp.  The tooth surface has been covered with wound adhesive (Dermabond).  You should contact your local dental provider immediately (tomorrow) for further management.  Take the antibiotics as prescribed, and the pain medicine as needed.  Follow-up with a local urgent care or the medical provider approved by your Worker's Comp. carrier.  Return to the ED as needed.

## 2018-12-31 NOTE — ED Provider Notes (Signed)
[facility] Emergency Department Provider Note ____________________________________________  Time seen: 1645  I have reviewed the triage vital signs and the nursing notes.  HISTORY  Chief Complaint  Fall  HPI Charles Harper is a 28 y.o. 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  Patient apparently tripped and fell, landing face first on the tile of 2 x 6 wooden beams.  He presents with pain primarily to the left cheek and nose.  Also notes some swelling to the upper lip lip on the left, and traumatically lower broken tooth.   Patient reports witnessed fall with confirmed loss of consciousness for a few seconds.  He denies any nausea, vomiting, weakness, visual change, or other injury at this time.  Past Medical History:  Diagnosis Date  . Depression     Patient Active Problem List   Diagnosis Date Noted  . 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 Marijuana abuse 06/14/2015  . Dysthymia 06/14/2015  . Suicidal ideation 06/14/2015    History reviewed. No pertinent surgical history.  Prior to Admission medications   Medication Sig Start Date End Date Taking? Authorizing Provider  amoxicillin (AMOXIL) 500 MG capsule Take 1 capsule (500 mg total) by mouth 3 (three) times daily. 12/31/18   Gevork Ayyad, Charlesetta Ivory, PA-C  HYDROcodone-acetaminophen (NORCO) 5-325 MG tablet Take 1 tablet by mouth 3 (three) times daily as needed for up to 2 days. 12/31/18 01/02/19  Billiejo Sorto, Charlesetta Ivory, PA-C  ondansetron (ZOFRAN ODT) 4 MG disintegrating tablet Take 1 tablet (4 mg total) by mouth every 8 (eight) hours as needed for up to 3 days. 12/31/18 01/03/19  Nelson Noone, Charlesetta Ivory, PA-C  famotidine (PEPCID) 40 MG tablet Take 1 tablet (40 mg total) by mouth daily. 04/11/17 09/30/18  Mao Lockner, Charlesetta Ivory, PA-C  metoCLOPramide (REGLAN) 5 MG tablet Take 1 tablet (5 mg total)  by mouth every 8 (eight) hours as needed for nausea or vomiting. 04/11/17 09/30/18  Zaylan Kissoon, Charlesetta Ivory, PA-C    Allergies Patient has no known allergies.  History reviewed. No pertinent family history.  Social History Social History   Tobacco Use  . Smoking status: Current Every Day Smoker    Packs/day: 0.50    Types: Cigarettes  . Smokeless tobacco: Never Used  Substance Use Topics  . Alcohol use: Yes    Comment: occasionally  . Drug use: Yes    Types: Marijuana    Comment: last used 2 weeks ago    Review of Systems  Constitutional: Negative for fever. Eyes: Negative for visual changes. ENT: Negative for sore throat. Lip swelling & dental injury Cardiovascular: Negative for chest pain. Respiratory: Negative for shortness of breath. Gastrointestinal: Negative for abdominal pain, vomiting and diarrhea. Genitourinary: Negative for dysuria. Musculoskeletal: Negative for back pain. Skin: Negative for rash. Neurological: Negative for headaches, focal weakness or numbness. ____________________________________________  PHYSICAL EXAM:  VITAL SIGNS: ED Triage Vitals  Enc Vitals Group     BP 12/31/18 1512 124/69     Pulse Rate 12/31/18 1512 86     Resp 12/31/18 1512 19     Temp 12/31/18 1512 98.6 F (37 C)     Temp Source 12/31/18 1512 Oral     SpO2 12/31/18 1512 99 %     Weight 12/31/18 1511 175 lb (79.4 kg)     Height 12/31/18 1511 5\' 10"  (1.778 m)     Head Circumference --  Peak Flow --      Pain Score 12/31/18 1513 10     Pain Loc --      Pain Edu? --      Excl. in [place]? --     Constitutional: Alert and oriented. Well appearing and in no distress. GCS=15 Head: Normocephalic and atraumatic. Eyes: Conjunctivae are normal. PERRL. Normal extraocular movements and fundi bilaterally Ears: Canals clear. TMs intact bilaterally. Nose: No congestion/rhinorrhea/epistaxis.  No septal hematoma or deviation is appreciated.  Nares are patent bilaterally. Mouth/Throat:  Mucous membranes are moist.  Uvula is midline tonsils are flat.  Patient with local soft tissue swelling to the left side of the upper lip.  He also has a open fracture to the left lower secondary incisor (#23).  Pulp is exposed.  No tooth laxity or subluxation is appreciated.  Normal active range of the mandible without crepitus or dysfunction. Neck: Supple. No thyromegaly. Cardiovascular: Normal rate, regular rhythm. Normal distal pulses. Respiratory: Normal respiratory effort. No wheezes/rales/rhonchi. Gastrointestinal: Soft and nontender. No distention. Musculoskeletal: Nontender with normal range of motion in all extremities.  Neurologic: Cranial nerves II through XII grossly intact.  Normal gait without ataxia.  Negative pronator drift.  Normal speech and language. No gross focal neurologic deficits are appreciated. Skin:  Skin is warm, dry and intact. No rash noted. ____________________________________________   LABS (pertinent positives/negatives) Labs Reviewed - No data to display ____________________________________________   RADIOLOGY  CT Head/Maxillofacial w/o CM  IMPRESSION: 1. No acute intracranial abnormality. 2. Question a nondisplaced left nasal bone fracture. Correlate for point tenderness. 3. No other facial fractures are identified. 4. Mild asymmetric left malar and infraorbital soft tissue swelling. 5. Carious lesion involving the left mandibular cuspid. Correlate with dental exam. ___________________________________________  PROCEDURES  Zofran 4 mg ODT Norco 5-325 mg PO Procedures  Dental Fracture - #23 LL secondary incisor Open transverse fracture exposing pulp Tooth surface dried using gauze and O2 via cannula Dermabond applied to open fracture Procedure tolerated well by patient.  ____________________________________________  INITIAL IMPRESSION / ASSESSMENT AND PLAN / ED COURSE  Patient with ED evaluation of work-related injury resulting from a  mechanical fall.  Patient fell face forward into a pile of wood beams.  He sustained a closed nondisplaced nasal bone fracture as well as an open dental fracture.  CT of the head and face are otherwise negative at this time.  Patient will be treated empirically with prophylactic antibiotics for the open dental fracture.  The exposed pulp is covered with a Dermabond patch.  Patient is advised to follow-up emergently with a local dental provider for definitive management.  He is also provided with a small prescription for hydrocodone (#6), and Zofran for nausea as needed.  Work note provided for 1 day to help facilitate into follow-up.  Patient will return to the ED if necessary.  He will otherwise follow-up with a dental medical provider approved by his workers Production assistant, radio.  Charles Harper was evaluated in Emergency Department on 12/31/2018 for the symptoms described in the history of present illness. He was evaluated in the context of the global COVID-19 pandemic, which necessitated consideration that the patient might be at risk for infection with the SARS-CoV-2 virus that causes COVID-19. Institutional protocols and algorithms that pertain to the evaluation of patients at risk for COVID-19 are in a state of rapid change based on information released by regulatory bodies including the CDC and federal and state organizations. These policies and algorithms were followed during the patient's  care in the ED.  I reviewed the patient's prescription history over the last 12 months in the multi-state controlled substances database(s) that includes [place], [place], [place], [place], [place], [place], [place], [place], [place], [place], [place], [place], [place], and [place].  Results were notable for no RX history. ____________________________________________  FINAL CLINICAL IMPRESSION(S) / ED DIAGNOSES  Final diagnoses:  Contusion of face, initial encounter  Minor head  injury, initial encounter  Broken tooth due to trauma with complication, open, initial encounter  Closed fracture of nasal bone, initial encounter      Lissa HoardMenshew, Letonia Stead V Bacon, PA-C 12/31/18 1804    Minna AntisPaduchowski, Kevin, MD 12/31/18 2048

## 2018-12-31 NOTE — ED Notes (Signed)
Patients employer for [employer] [phone]

## 2018-12-31 NOTE — ED Notes (Addendum)
Pt reports he was at work and he fell approx 4 ft, with the left side of his face hitting wood. Reports lightheadedness with light sensitivity. Pt unsure whether he lost consciousness. Denies N/V. Pt alert and oriented X4.  Swelling noted to left side of pt's mouth, and bottom tooth broken. Pt appears in NAD

## 2019-04-02 ENCOUNTER — Ambulatory Visit: Payer: HRSA Program | Attending: Internal Medicine

## 2019-04-02 DIAGNOSIS — Z20822 Contact with and (suspected) exposure to covid-19: Secondary | ICD-10-CM | POA: Insufficient documentation

## 2019-04-03 LAB — NOVEL CORONAVIRUS, NAA: SARS-CoV-2, NAA: NOT DETECTED

## 2021-03-20 IMAGING — CT CT HEAD W/O CM
3 series · 15 of 46 positions shown, 18 images · non-contrast
Comparison: None.

CLINICAL DATA: Fall with left head and facial strike

EXAM:
CT HEAD WITHOUT CONTRAST
CT MAXILLOFACIAL WITHOUT CONTRAST
TECHNIQUE: Multidetector CT imaging of the head and maxillofacial structures
were performed using the standard protocol without intravenous
contrast. Multiplanar CT image reconstructions of the maxillofacial
structures were also generated.

[Series 2: head wo · axial · 0.41mm/px · z∈[+791,+911]mm · 9 of 29 slices shown, 12 images]
[im 3/29  brain]
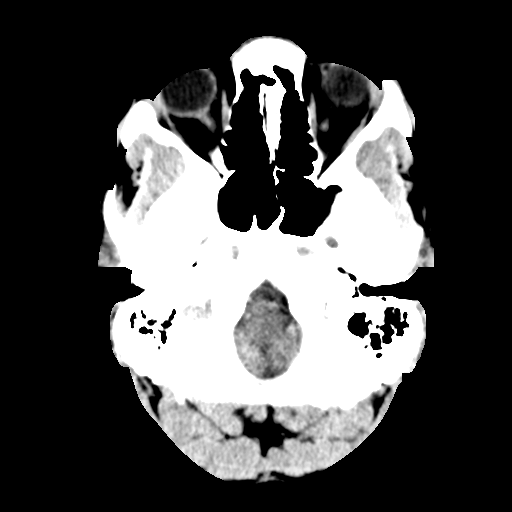
[im 3/29  bone]
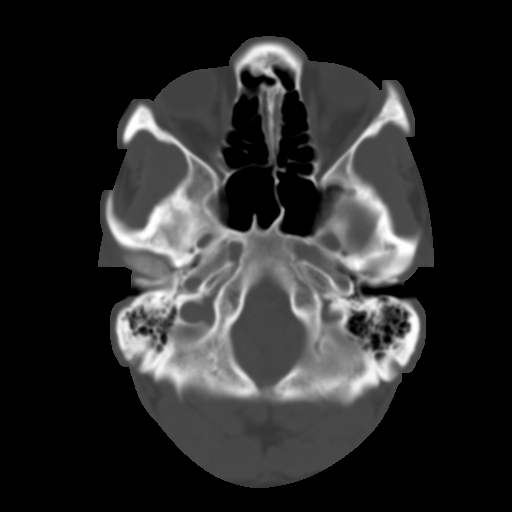
[im 6/29  brain]
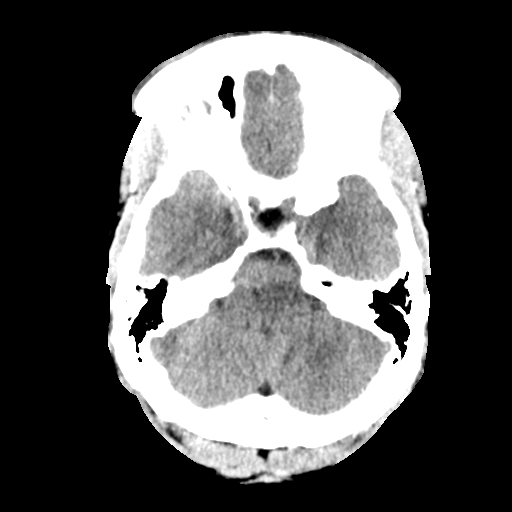
[im 9/29  brain]
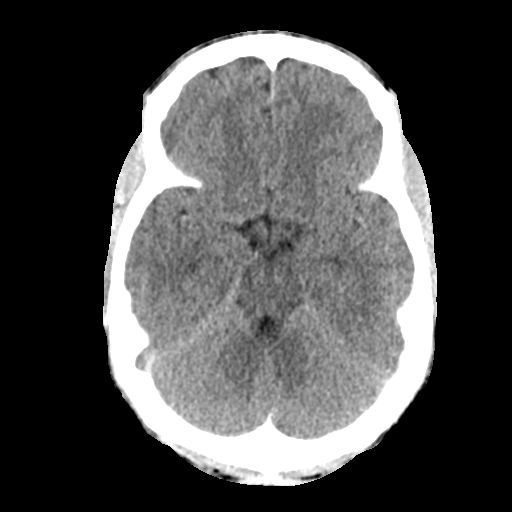
[im 12/29  brain]
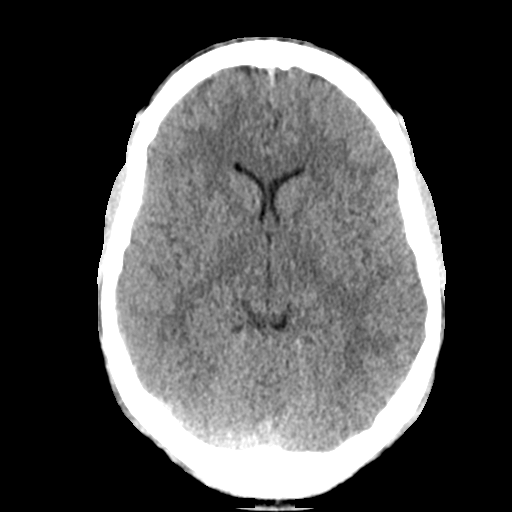
[im 15/29  brain]
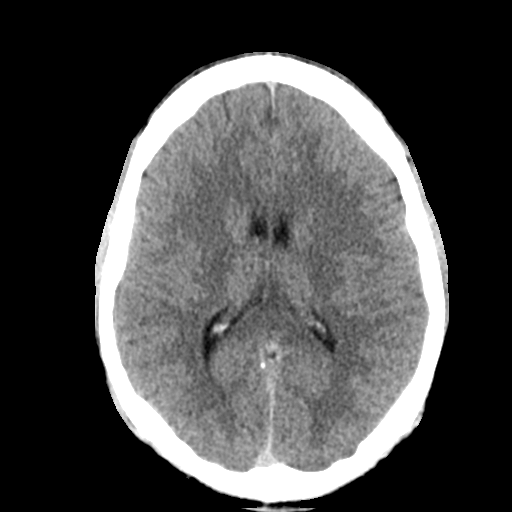
[im 15/29  bone]
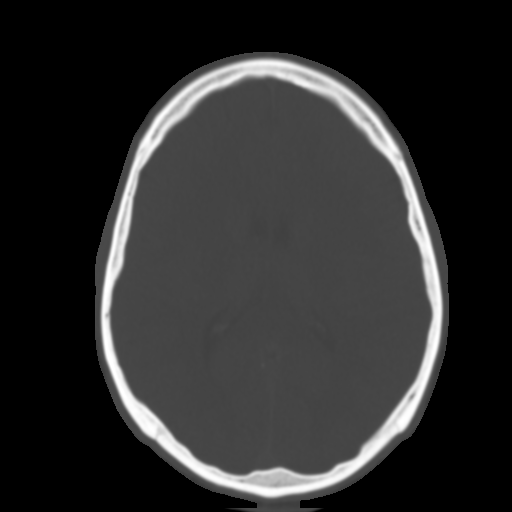
[im 18/29  brain]
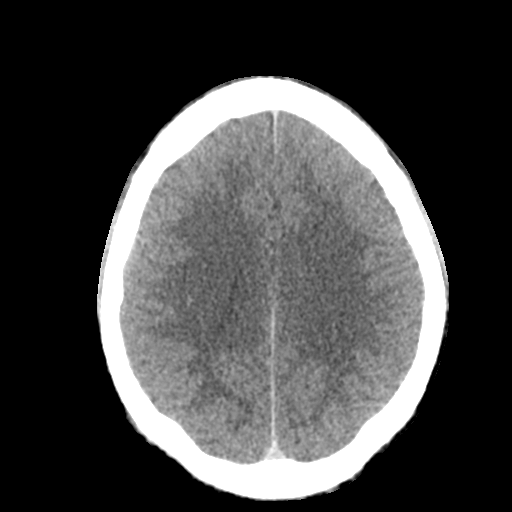
[im 21/29  brain]
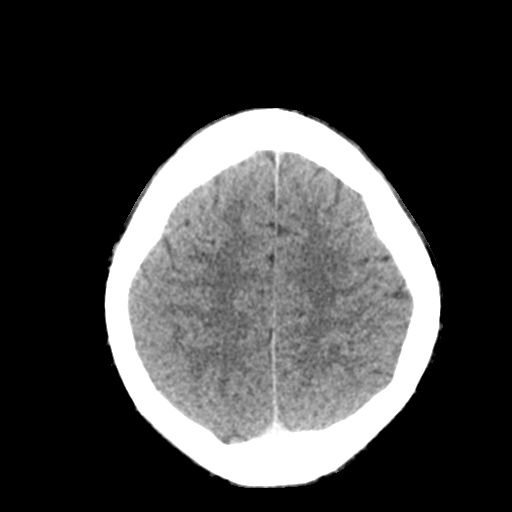
[im 24/29  brain]
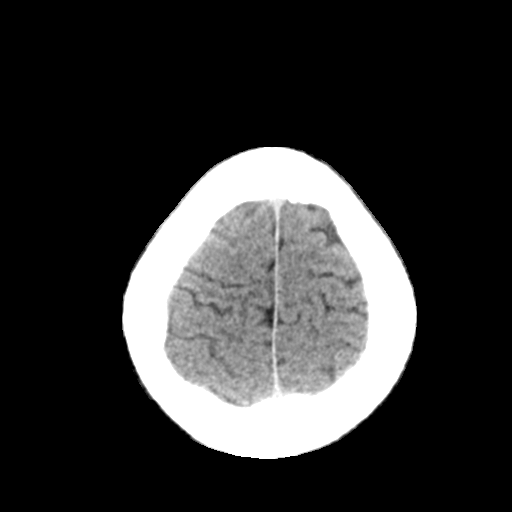
[im 27/29  brain]
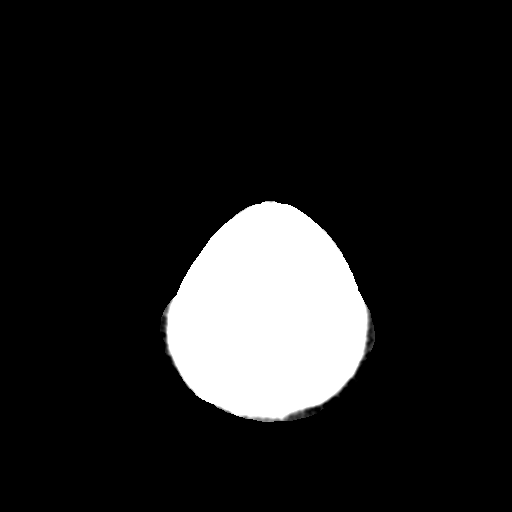
[im 27/29  bone]
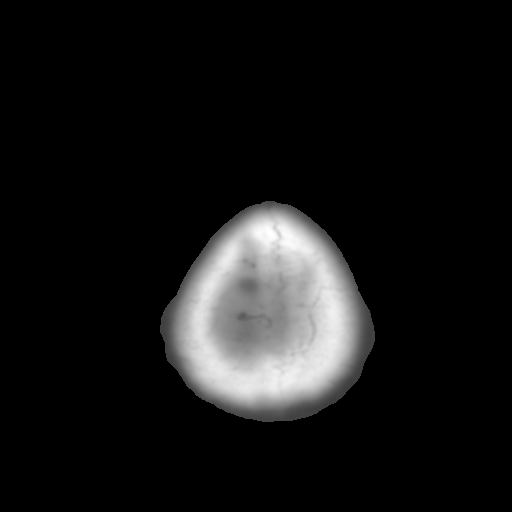

[Series 4: coronal soft tissue · coronal · 0.31mm/px · 3 of 66 slices shown]
[im 22/66  brain]
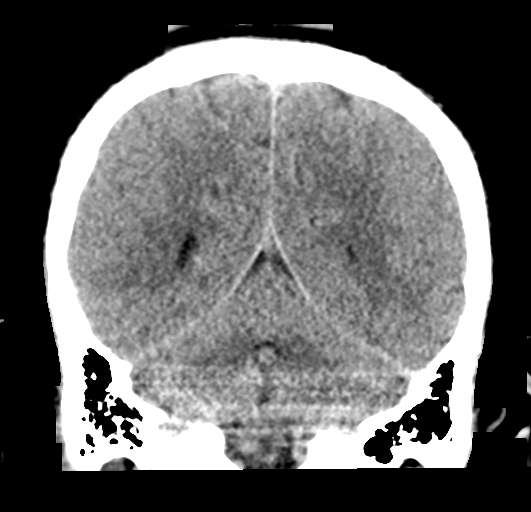
[im 29/66  brain]
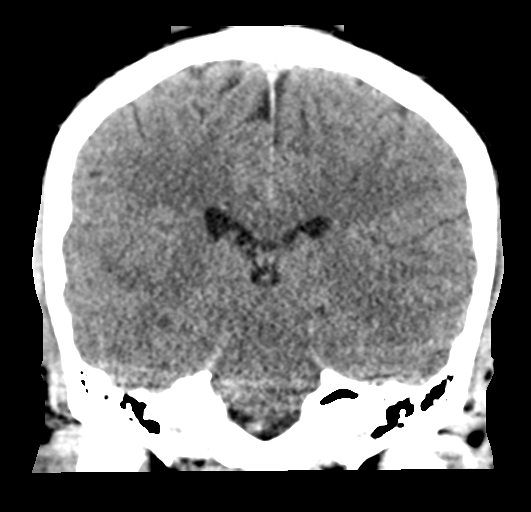
[im 37/66  brain]
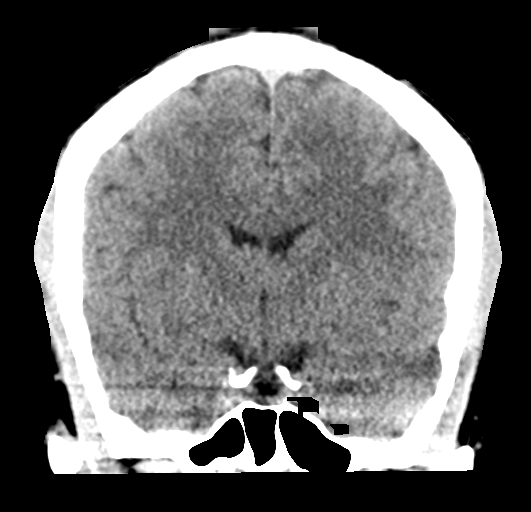

[Series 5: sagittal soft tissue · sagittal · 0.31mm/px · 3 of 55 slices shown]
[im 19/55  brain]
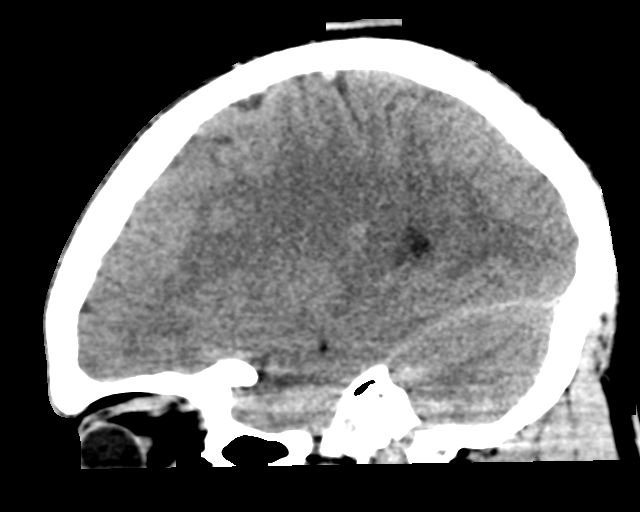
[im 28/55  brain]
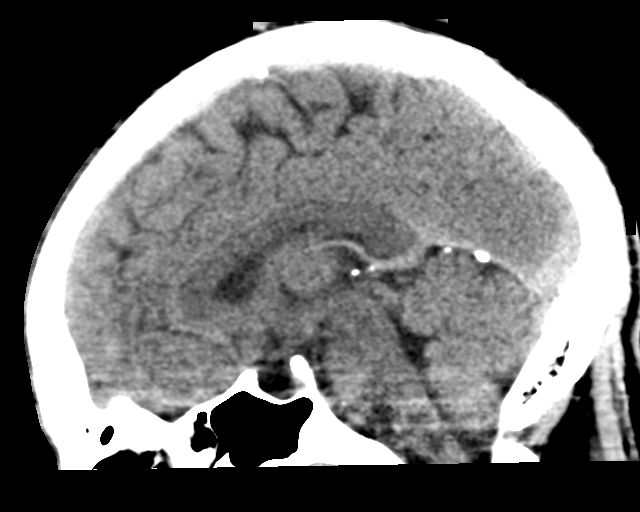
[im 37/55  brain]
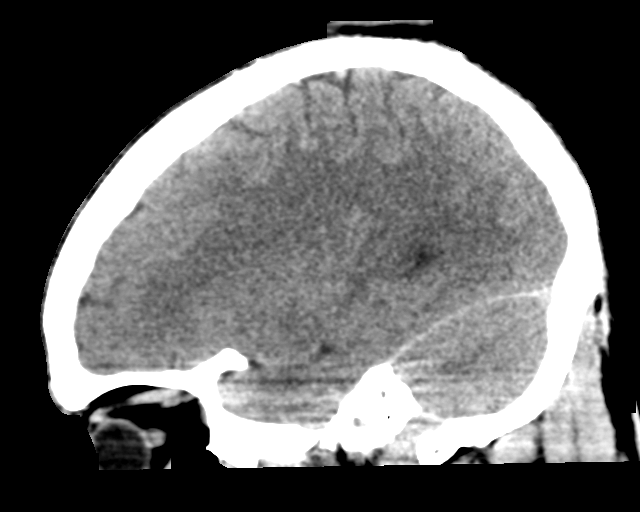

[15 of 46 positions shown; findings below may reference images not displayed]

FINDINGS: CT HEAD FINDINGS

Brain: No evidence of acute infarction, hemorrhage, hydrocephalus,
extra-axial collection or mass lesion/mass effect.

Vascular: No hyperdense vessel or unexpected calcification.

Skull: No calvarial fracture or suspicious osseous lesion. No scalp
swelling or hematoma.

Other: None

CT MAXILLOFACIAL FINDINGS

Osseous: No fracture of the bony orbits. Question a nondisplaced
fracture of the left nasal bone. No other mid face fractures are
seen. The pterygoid plates are intact. The mandible is intact.
Temporomandibular joints are normally aligned. No temporal bone
fractures are identified. No fractured or avulsed teeth. Carious
lesion involving the left mandibular cuspid.

Orbits: No retro septal gas or stranding. The globes appear normal
and symmetric. Symmetric appearance of the extraocular musculature
and optic nerve sheath complexes. Normal caliber of the superior
ophthalmic veins.

Sinuses: Paranasal sinuses and mastoid air cells are predominantly
clear. Middle ear cavities are clear. Ossicular chains are grossly
intact.

Soft tissues: Mild asymmetric left malar and infraorbital soft
tissue swelling. Benign punctate dermal calcifications, likely a
physiologic finding. No soft tissue gas or foreign body.

Limited cervical: Craniocervical atlantoaxial alignment is
maintained. Slight straightening of the normal cervical lordosis.
Early disc osteophyte formations noted at C2-3 and C3-4.
IMPRESSION: 1. No acute intracranial abnormality.
2. Question a nondisplaced left nasal bone fracture. Correlate for
point tenderness.
3. No other facial fractures are identified.
4. Mild asymmetric left malar and infraorbital soft tissue swelling.
5. Carious lesion involving the left mandibular cuspid. Correlate
with dental exam.

## 2022-09-24 ENCOUNTER — Emergency Department
Admission: EM | Admit: 2022-09-24 | Discharge: 2022-09-24 | Disposition: A | Discharge status: Home / Self Care | Payer: Self-pay | Attending: Emergency Medicine | Admitting: Emergency Medicine

## 2022-09-24 ENCOUNTER — Other Ambulatory Visit: Payer: Self-pay

## 2022-09-24 DIAGNOSIS — M25552 Pain in left hip: Secondary | ICD-10-CM | POA: Insufficient documentation

## 2022-09-24 DIAGNOSIS — M549 Dorsalgia, unspecified: Secondary | ICD-10-CM | POA: Insufficient documentation

## 2022-09-24 MED ORDER — TRAMADOL HCL 50 MG PO TABS: 50.0000 mg | ORAL_TABLET | Freq: Four times a day (QID) | ORAL | 0 refills | Status: DC | PRN

## 2022-09-24 MED ORDER — KETOROLAC TROMETHAMINE 30 MG/ML IJ SOLN
30.0000 mg | Freq: Once | INTRAMUSCULAR | Status: AC
  Administered 2022-09-24: 30 mg via INTRAMUSCULAR
  Filled 2022-09-24: qty 1

## 2022-09-24 MED ORDER — METHOCARBAMOL 500 MG PO TABS: 500.0000 mg | ORAL_TABLET | Freq: Three times a day (TID) | ORAL | 1 refills | Status: DC | PRN

## 2022-09-24 MED ORDER — NAPROXEN 500 MG PO TABS: 500.0000 mg | ORAL_TABLET | Freq: Two times a day (BID) | ORAL | 2 refills | Status: DC

## 2022-09-24 NOTE — ED Notes (Signed)
See triage note  Presents with left lower back pain which is moving into left leg  Pain started about a month or so ago  denies any injury or urinary sx'

## 2022-09-24 NOTE — ED Provider Notes (Signed)
[facility] Provider Note    Event Date/Time   First MD Initiated Contact with Patient 09/24/22 1426     (approximate)   History   Flank Pain   HPI  Charles Harper is a 32 y.o. male who presents with complaints of left hip pain.  Patient reports this has been ongoing for several months, seems of worsened over the last several days.  Typically controlled with stretching and heating pad.  Worse now with movement.  No trauma reported.     Physical Exam   Triage Vital Signs: ED Triage Vitals  Encounter Vitals Group     BP 09/24/22 1419 120/72     Systolic BP Percentile --      Diastolic BP Percentile --      Pulse Rate 09/24/22 1419 95     Resp 09/24/22 1419 18     Temp 09/24/22 1419 98.7 F (37.1 C)     Temp Source 09/24/22 1419 Oral     SpO2 09/24/22 1419 97 %     Weight 09/24/22 1420 93.9 kg (207 lb)     Height 09/24/22 1420 1.778 m (5\' 10" )     Head Circumference --      Peak Flow --      Pain Score 09/24/22 1420 10     Pain Loc --      Pain Education --      Exclude from Growth Chart --     Most recent vital signs: Vitals:   09/24/22 1419  BP: 120/72  Pulse: 95  Resp: 18  Temp: 98.7 F (37.1 C)  SpO2: 97%     General: Awake, no distress.  CV:  Good peripheral perfusion.  Resp:  Normal effort.  Abd:  No distention.  Other:  Tenderness overlying the origin of the hip extensors on the left, otherwise extremity is warm and well-perfused, no rash.  No vertebral test palpation   ED Results / Procedures / Treatments   Labs (all labs ordered are listed, but only abnormal results are displayed) Labs Reviewed - No data to display   EKG     RADIOLOGY     PROCEDURES:  Critical Care performed:   Procedures   MEDICATIONS ORDERED IN ED: Medications  ketorolac (TORADOL) 30 MG/ML injection 30 mg (30 mg Intramuscular Given 09/24/22 1454)     IMPRESSION / MDM / ASSESSMENT AND PLAN / ED COURSE  I reviewed the triage  vital signs and the nursing notes. Patient's presentation is most consistent with acute, uncomplicated illness.  Patient's presentation is consistent with musculoskeletal pain, acute on chronic.  Treated with IM Toradol here, will Rx Naprosyn, Robaxin, Ultram  Referral to physiatry for further evaluation and treatment        FINAL CLINICAL IMPRESSION(S) / ED DIAGNOSES   Final diagnoses:  Musculoskeletal back pain     Rx / DC Orders   ED Discharge Orders          Ordered    naproxen (NAPROSYN) 500 MG tablet  2 times daily with meals        09/24/22 1445    methocarbamol (ROBAXIN) 500 MG tablet  Every 8 hours PRN        09/24/22 1445    traMADol (ULTRAM) 50 MG tablet  Every 6 hours PRN        09/24/22 1445             Note:  This document was prepared using Dragon voice  recognition software and may include unintentional dictation errors.   Jene Every, MD 09/24/22 1515

## 2022-09-24 NOTE — ED Triage Notes (Signed)
Complaining of Left flank pain and pain radiates to left leg. Worse with movement. Denies any urinary symptoms. Started 2 and a half months ago but pain got worse over the last few days.

## 2023-03-01 ENCOUNTER — Emergency Department
Admission: EM | Admit: 2023-03-01 | Discharge: 2023-03-02 | Disposition: A | Discharge status: Home / Self Care | Payer: Self-pay | Attending: Student in an Organized Health Care Education/Training Program | Admitting: Student in an Organized Health Care Education/Training Program

## 2023-03-01 ENCOUNTER — Other Ambulatory Visit: Payer: Self-pay

## 2023-03-01 DIAGNOSIS — X58XXXA Exposure to other specified factors, initial encounter: Secondary | ICD-10-CM | POA: Insufficient documentation

## 2023-03-01 DIAGNOSIS — R109 Unspecified abdominal pain: Secondary | ICD-10-CM | POA: Insufficient documentation

## 2023-03-01 DIAGNOSIS — S39012A Strain of muscle, fascia and tendon of lower back, initial encounter: Secondary | ICD-10-CM | POA: Insufficient documentation

## 2023-03-01 LAB — URINALYSIS, ROUTINE W REFLEX MICROSCOPIC
Bacteria, UA: NONE SEEN
Bilirubin Urine: NEGATIVE
Glucose, UA: NEGATIVE mg/dL
Hgb urine dipstick: NEGATIVE
Ketones, ur: NEGATIVE mg/dL
Leukocytes,Ua: NEGATIVE
Nitrite: NEGATIVE
Protein, ur: 30 mg/dL — AB
Specific Gravity, Urine: 1.039 — ABNORMAL HIGH (ref 1.005–1.030)
Squamous Epithelial / HPF: 0 /[HPF] (ref 0–5)
pH: 5 (ref 5.0–8.0)

## 2023-03-01 MED ORDER — METHOCARBAMOL 500 MG PO TABS: 500.0000 mg | ORAL_TABLET | Freq: Three times a day (TID) | ORAL | 1 refills | Status: DC | PRN

## 2023-03-01 MED ORDER — TRAMADOL HCL 50 MG PO TABS: 50.0000 mg | ORAL_TABLET | Freq: Three times a day (TID) | ORAL | 0 refills | Status: AC | PRN

## 2023-03-01 MED ORDER — KETOROLAC TROMETHAMINE 30 MG/ML IJ SOLN
30.0000 mg | Freq: Once | INTRAMUSCULAR | Status: AC
  Administered 2023-03-01: 30 mg via INTRAMUSCULAR
  Filled 2023-03-01: qty 1

## 2023-03-01 MED ORDER — NAPROXEN 500 MG PO TABS: 500.0000 mg | ORAL_TABLET | Freq: Two times a day (BID) | ORAL | 1 refills | Status: DC

## 2023-03-01 NOTE — ED Provider Notes (Signed)
 [facility] Emergency Department Provider Note     Event Date/Time   First MD Initiated Contact with Patient 03/01/23 2238     (approximate)   History   Back Pain   HPI  Kyri Dai is a 33 y.o. male with a history of adjustment disorder and depression, presents to the ED for evaluation of nontraumatic low back pain.  He reports 4 days of symptoms that he attributes to sleeping on the reclining chair in the couch over the last few days.  Patient would endorse referral into his left lower leg.  He denies any bladder or bowel incontinence, foot drop, or saddle anesthesia.  He denies any urinary urgency, frequency, hematuria, urinary retention.  No history of kidney stones reported.  No other injury reported at this time.  Physical Exam   Triage Vital Signs: ED Triage Vitals  Encounter Vitals Group     BP 03/01/23 2205 127/75     Systolic BP Percentile --      Diastolic BP Percentile --      Pulse Rate 03/01/23 2205 87     Resp 03/01/23 2205 20     Temp 03/01/23 2205 98.1 F (36.7 C)     Temp Source 03/01/23 2205 Oral     SpO2 03/01/23 2205 97 %     Weight 03/01/23 2207 190 lb (86.2 kg)     Height 03/01/23 2207 5' 10 (1.778 m)     Head Circumference --      Peak Flow --      Pain Score 03/01/23 2207 10     Pain Loc --      Pain Education --      Exclude from Growth Chart --     Most recent vital signs: Vitals:   03/01/23 2205  BP: 127/75  Pulse: 87  Resp: 20  Temp: 98.1 F (36.7 C)  SpO2: 97%    General Awake, no distress. NAD HEENT NCAT. PERRL. EOMI. No rhinorrhea. Mucous membranes are moist.  CV:  Good peripheral perfusion. RRR RESP:  Normal effort. CTA ABD:  No distention. Soft, nontender. No CVA tenderness MSK:  Normal spinal alignment.  No spasm, deformity, or step-off.  Patient with identified patient to the left superior iliac spine.  Normal lumbar flexion and range on exam.  Normal hip flexion and extension  noted. NEURO: Cranial nerves II to XII grossly intact.  Normal LE DTRs bilaterally.  ED Results / Procedures / Treatments   Labs (all labs ordered are listed, but only abnormal results are displayed) Labs Reviewed  URINALYSIS, ROUTINE W REFLEX MICROSCOPIC - Abnormal; Notable for the following components:      Result Value   Color, Urine AMBER (*)    APPearance HAZY (*)    Specific Gravity, Urine 1.039 (*)    Protein, ur 30 (*)    All other components within normal limits     EKG   RADIOLOGY  No results found.   PROCEDURES:  Critical Care performed: No  Procedures   MEDICATIONS ORDERED IN ED: Medications  ketorolac  (TORADOL ) 30 MG/ML injection 30 mg (30 mg Intramuscular Given 03/01/23 2345)     IMPRESSION / MDM / ASSESSMENT AND PLAN / ED COURSE  I reviewed the triage vital signs and the nursing notes.                              Differential diagnosis includes, but  is not limited to, lumbar strain, lumbar radiculopathy, myalgias, flank pain, nephrolithiasis  Patient's presentation is most consistent with acute, uncomplicated illness.  Patient's diagnosis is consistent with lumbar strain and flank pain.  No UA evidence of an hematuria.  Low concern for nephrolithiasis.  Patient with reproducible pain on exam.  No red flags on exam.  Patient will be discharged home with prescriptions for naproxen , Robaxin , and Ultram . Patient is to follow up with local urgent care as discussed, as needed or otherwise directed. Patient is given ED precautions to return to the ED for any worsening or new symptoms.  FINAL CLINICAL IMPRESSION(S) / ED DIAGNOSES   Final diagnoses:  Strain of lumbar region, initial encounter  Flank pain     Rx / DC Orders   ED Discharge Orders          Ordered    methocarbamol  (ROBAXIN ) 500 MG tablet  Every 8 hours PRN        03/01/23 2329    naproxen  (NAPROSYN ) 500 MG tablet  2 times daily with meals        03/01/23 2329    traMADol  (ULTRAM )  50 MG tablet  3 times daily PRN        03/01/23 2329             Note:  This document was prepared using Dragon voice recognition software and may include unintentional dictation errors.    Loyd Candida LULLA Aldona, PA-C 03/01/23 2359    Cyrena Mylar, MD 03/02/23 [phone]

## 2023-03-01 NOTE — ED Triage Notes (Signed)
 Pt reports lower back pain that radiates to left lower leg. Pt denies any injury. Pt talks in complete sentences no respiratory distress noted

## 2023-03-01 NOTE — Discharge Instructions (Addendum)
 Take the prescription meds as directed.  Apply ice and/or moist heat to your back to help reduce symptoms.  Follow-up with your primary provider or local urgent care.

## 2023-05-13 ENCOUNTER — Other Ambulatory Visit: Payer: Self-pay

## 2023-05-13 ENCOUNTER — Emergency Department: Payer: Self-pay

## 2023-05-13 ENCOUNTER — Emergency Department
Admission: EM | Admit: 2023-05-13 | Discharge: 2023-05-13 | Disposition: A | Discharge status: Home / Self Care | Payer: Self-pay | Attending: Emergency Medicine | Admitting: Emergency Medicine

## 2023-05-13 DIAGNOSIS — M5442 Lumbago with sciatica, left side: Secondary | ICD-10-CM | POA: Insufficient documentation

## 2023-05-13 MED ORDER — MELOXICAM 15 MG PO TABS: 15.0000 mg | ORAL_TABLET | Freq: Every day | ORAL | 2 refills | Status: DC

## 2023-05-13 MED ORDER — LIDOCAINE 5 % EX PTCH
1.0000 | MEDICATED_PATCH | Freq: Once | CUTANEOUS | Status: DC
  Administered 2023-05-13: 1 via TRANSDERMAL
  Filled 2023-05-13: qty 1

## 2023-05-13 MED ORDER — BACLOFEN 10 MG PO TABS: 10.0000 mg | ORAL_TABLET | Freq: Three times a day (TID) | ORAL | 0 refills | Status: AC

## 2023-05-13 MED ORDER — KETOROLAC TROMETHAMINE 15 MG/ML IJ SOLN
15.0000 mg | Freq: Once | INTRAMUSCULAR | Status: AC
  Administered 2023-05-13: 15 mg via INTRAMUSCULAR
  Filled 2023-05-13: qty 1

## 2023-05-13 NOTE — ED Provider Notes (Signed)
 [facility] Provider Note    Event Date/Time   First MD Initiated Contact with Patient 05/13/23 [phone]     (approximate)   History   Back Pain   HPI  Amit Leece is a 33 y.o. male with no significant past medical history presents emergency department complaining of left-sided low back pain that radiates to the left leg.  States radiates to the back of the knee.  No known injury.  Similar symptoms over the past 6 months.  Was seen here and given muscle relaxer, anti-inflammatory, and tramadol .  States none of that helped his pain but he only took it 1 time.  No loss of bowel or bladder control.  No known injury.  Has not followed up with orthopedics      Physical Exam   Triage Vital Signs: ED Triage Vitals  Encounter Vitals Group     BP 05/13/23 0813 132/88     Systolic BP Percentile --      Diastolic BP Percentile --      Pulse Rate 05/13/23 0813 (!) 109     Resp 05/13/23 0813 18     Temp 05/13/23 0813 97.8 F (36.6 C)     Temp src --      SpO2 05/13/23 0813 100 %     Weight 05/13/23 0812 200 lb (90.7 kg)     Height 05/13/23 0812 5\' 11"  (1.803 m)     Head Circumference --      Peak Flow --      Pain Score 05/13/23 0812 10     Pain Loc --      Pain Education --      Exclude from Growth Chart --     Most recent vital signs: Vitals:   05/13/23 0813  BP: 132/88  Pulse: (!) 109  Resp: 18  Temp: 97.8 F (36.6 C)  SpO2: 100%     General: Awake, no distress.   CV:  Good peripheral perfusion. regular rate and  rhythm Resp:  Normal effort.  Abd:  No distention.   Other:  Lumbar spine minimally tender, paravertebral muscles tender, sartorius muscle tender to palpation, IT band nontender, neurovascular is intact, patient did walk down the hallway without any difficulty   ED Results / Procedures / Treatments   Labs (all labs ordered are listed, but only abnormal results are displayed) Labs Reviewed - No data to  display   EKG     RADIOLOGY X-ray lumbar spine    PROCEDURES:   Procedures Chief Complaint  Patient presents with   Back Pain      MEDICATIONS ORDERED IN ED: Medications  lidocaine  (LIDODERM ) 5 % 1 patch (1 patch Transdermal Patch Applied 05/13/23 0826)  ketorolac  (TORADOL ) 15 MG/ML injection 15 mg (15 mg Intramuscular Given 05/13/23 0826)     IMPRESSION / MDM / ASSESSMENT AND PLAN / ED COURSE  I reviewed the triage vital signs and the nursing notes.                              Differential diagnosis includes, but is not limited to, lumbar strain, lumbar radiculopathy, muscle spasm, tendinitis, cauda equina  Patient's presentation is most consistent with acute illness / injury with system symptoms.   Cauda equina less likely patient's had no loss of bowel or bladder control, radiation of pain is to the back of the knee.  This has been a chronic ongoing  problem.  X-ray of the lumbar spine Toradol  15 mg IM, Lidoderm  patch   I did review the images of the lumbar spine, appears to have a small amount of scoliosis, radiologist read states scoliosis along the L4-L5 level  I did explain these findings to patient.  Did have relief with Toradol  and Lidoderm  patch.  He is to follow-up with orthopedics.  Did explain to him that continuing to return to the emergency department for the same problem is not getting him to care that he needs.  He does need to see orthopedics as this is becoming a chronic problem.  He states he understands.  He was given a work note discharged stable condition.   FINAL CLINICAL IMPRESSION(S) / ED DIAGNOSES   Final diagnoses:  Acute midline low back pain with left-sided sciatica     Rx / DC Orders   ED Discharge Orders          Ordered    meloxicam  (MOBIC ) 15 MG tablet  Daily        05/13/23 1106    baclofen  (LIORESAL ) 10 MG tablet  3 times daily        05/13/23 1106             Note:  This document was prepared using Dragon  voice recognition software and may include unintentional dictation errors.    Delsie Figures, PA-C 05/13/23 1226    Charleen Conn, MD 05/14/23 [phone]

## 2023-05-13 NOTE — ED Triage Notes (Signed)
 Pt comes with lower back pain for about 2 months. Pt states he thinks it might be herniated disc. Pt states left lower pain.

## 2023-07-01 ENCOUNTER — Other Ambulatory Visit: Payer: Self-pay

## 2023-07-01 ENCOUNTER — Emergency Department
Admission: EM | Admit: 2023-07-01 | Discharge: 2023-07-01 | Disposition: A | Discharge status: Home / Self Care | Payer: Self-pay | Attending: Emergency Medicine | Admitting: Emergency Medicine

## 2023-07-01 DIAGNOSIS — M5432 Sciatica, left side: Secondary | ICD-10-CM | POA: Insufficient documentation

## 2023-07-01 MED ORDER — NAPROXEN 500 MG PO TABS: 500.0000 mg | ORAL_TABLET | Freq: Two times a day (BID) | ORAL | 2 refills | Status: AC

## 2023-07-01 MED ORDER — PREDNISONE 20 MG PO TABS
60.0000 mg | ORAL_TABLET | Freq: Once | ORAL | Status: AC
  Administered 2023-07-01: 60 mg via ORAL
  Filled 2023-07-01: qty 3

## 2023-07-01 MED ORDER — KETOROLAC TROMETHAMINE 30 MG/ML IJ SOLN
30.0000 mg | Freq: Once | INTRAMUSCULAR | Status: AC
  Administered 2023-07-01: 30 mg via INTRAMUSCULAR
  Filled 2023-07-01: qty 1

## 2023-07-01 MED ORDER — PREDNISONE 10 MG (21) PO TBPK: ORAL_TABLET | ORAL | 0 refills | Status: AC

## 2023-07-01 MED ORDER — CYCLOBENZAPRINE HCL 5 MG PO TABS: 5.0000 mg | ORAL_TABLET | Freq: Three times a day (TID) | ORAL | 0 refills | Status: AC | PRN

## 2023-07-01 MED ORDER — ACETAMINOPHEN 325 MG PO TABS
650.0000 mg | ORAL_TABLET | Freq: Once | ORAL | Status: AC
  Administered 2023-07-01: 650 mg via ORAL
  Filled 2023-07-01: qty 2

## 2023-07-01 NOTE — Discharge Instructions (Addendum)
 Please follow-up with orthopedics for further evaluation and management.  The interim, get plenty of rest and stay hydrated.  Alternating ice and warm compresses to the area will help alleviate pain.

## 2023-07-01 NOTE — ED Triage Notes (Signed)
 Pt to ED for sharp, shooting, stabbing pain from lower back, down L leg. Ongoing for 2 months, worse since few days ago. Had to leave work today due to pain. States was told here one time that he might have scoliosis. Pain hit him really bad when he lifted his leg last night.

## 2023-07-01 NOTE — ED Notes (Signed)
 See triage notes. Patient c/o left lower back pain that radiates down into his left leg. Patient stated he believes he may have turned wrong but denies injury otherwise. Denies any bowel or urinary issues.

## 2023-07-01 NOTE — ED Provider Notes (Signed)
 [facility] Emergency Department Provider Note     Event Date/Time   First MD Initiated Contact with Patient 07/01/23 1639     (approximate)   History   Back Pain and Leg Pain   HPI  Charles Harper is a 33 y.o. male with no significant past medical history presents to the ED for evaluation of left sided gluteal pain that radiates down to his left leg.  States similar symptoms for a couple months now, but severe pain last night.  Denies injury or falls.  Denies loss of bladder and bowel control, saddle anesthesia, urinary symptoms.   Chart reviewed.  Last ED visit 04/21 seen for the same.  Lumbar x-ray revealed small amount of scoliosis along the L4-L5 level but otherwise unremarkable.  He has not been able to follow-up with orthopedics.   Physical Exam   Triage Vital Signs: ED Triage Vitals  Encounter Vitals Group     BP 07/01/23 1434 (!) 142/91     Systolic BP Percentile --      Diastolic BP Percentile --      Pulse Rate 07/01/23 1434 78     Resp 07/01/23 1434 20     Temp 07/01/23 1434 98.4 F (36.9 C)     Temp Source 07/01/23 1434 Oral     SpO2 07/01/23 1434 100 %     Weight 07/01/23 1433 180 lb (81.6 kg)     Height 07/01/23 1433 5\' 8"  (1.727 m)     Head Circumference --      Peak Flow --      Pain Score 07/01/23 1434 10     Pain Loc --      Pain Education --      Exclude from Growth Chart --     Most recent vital signs: Vitals:   07/01/23 1434  BP: (!) 142/91  Pulse: 78  Resp: 20  Temp: 98.4 F (36.9 C)  SpO2: 100%   General: Well appearing. Alert and oriented. INAD.  Skin:  Warm, dry and intact. No rashes or lesions noted.     Head:  NCAT.  Eyes:  PERRLA. EOMI.  CV:  Good peripheral perfusion.  RESP:  Normal effort.  ABD:  No distention. Soft, Non tender. No CVA tenderness bilaterally.  BACK:  Spinous process is midline without deformity or tenderness. Mild tenderness to left lateral gluteal muscle. (+) left SLRT @ 30  degrees  MSK:   Full ROM in all joints. No swelling, deformity or tenderness.  NEURO: Cranial nerves II-XII intact. No focal deficits. Sensation and motor function intact. 5/5 muscle strength of UE & LE. Gait is steady.   ED Results / Procedures / Treatments   Labs (all labs ordered are listed, but only abnormal results are displayed) Labs Reviewed - No data to display  No results found.  PROCEDURES:  Critical Care performed: No  Procedures   MEDICATIONS ORDERED IN ED: Medications  ketorolac  (TORADOL ) 30 MG/ML injection 30 mg (30 mg Intramuscular Given 07/01/23 1731)  acetaminophen  (TYLENOL ) tablet 650 mg (650 mg Oral Given 07/01/23 1730)  predniSONE (DELTASONE) tablet 60 mg (60 mg Oral Given 07/01/23 1730)     IMPRESSION / MDM / ASSESSMENT AND PLAN / ED COURSE  I reviewed the triage vital signs and the nursing notes.                               33  y.o. male presents to the emergency department for evaluation and treatment of acute on chronic left gluteal pain with radiation to left leg. See HPI for further details.   Differential diagnosis includes, but is not limited to sciatica, strain, spasm, cauda equina considered but less likely   Patient's presentation is most consistent with acute complicated illness / injury requiring diagnostic workup.  Patient is alert and oriented.  He is hemodynamically stable.  Physical exam findings are pertinent for left straight leg raise test which makes my suspicion for sciatica very high.  I encourage patient to follow-up with orthopedic for consistent pain control management.  Toradol  IM, Tylenol  and a steroid started in ED.  Will send prescription for muscle relaxer and steroid pack as patient drove himself to the ED today.  He is in stable condition for discharge home.  ED return precautions discussed.  Work note requested and provided.   FINAL CLINICAL IMPRESSION(S) / ED DIAGNOSES   Final diagnoses:  Sciatica of left side     Rx /  DC Orders   ED Discharge Orders          Ordered    cyclobenzaprine (FLEXERIL) 5 MG tablet  3 times daily PRN        07/01/23 1735    predniSONE (STERAPRED UNI-PAK 21 TAB) 10 MG (21) TBPK tablet        07/01/23 1735    naproxen  (NAPROSYN ) 500 MG tablet  2 times daily with meals        07/01/23 1735            Note:  This document was prepared using Dragon voice recognition software and may include unintentional dictation errors.    Phyllis Breeze, Clydie Dillen A, PA-C 07/01/23 1918    Kandee Orion, MD 07/01/23 2059
